# Patient Record
Sex: Female | Born: 1980 | Race: White | Hispanic: No | Marital: Married | Smoking: Former smoker
Health system: Southern US, Community
[De-identification: ages and names within clinical notes are randomized; demographics above are authoritative.]

## PROBLEM LIST (undated history)

## (undated) DIAGNOSIS — R000 Tachycardia, unspecified: Secondary | ICD-10-CM

## (undated) DIAGNOSIS — Z87442 Personal history of urinary calculi: Secondary | ICD-10-CM

## (undated) DIAGNOSIS — I471 Supraventricular tachycardia, unspecified: Secondary | ICD-10-CM

## (undated) DIAGNOSIS — M5412 Radiculopathy, cervical region: Secondary | ICD-10-CM

## (undated) DIAGNOSIS — I7300 Raynaud's syndrome without gangrene: Secondary | ICD-10-CM

## (undated) DIAGNOSIS — I4711 Inappropriate sinus tachycardia, so stated: Secondary | ICD-10-CM

## (undated) DIAGNOSIS — I77810 Thoracic aortic ectasia: Secondary | ICD-10-CM

## (undated) DIAGNOSIS — R112 Nausea with vomiting, unspecified: Secondary | ICD-10-CM

## (undated) DIAGNOSIS — N200 Calculus of kidney: Secondary | ICD-10-CM

## (undated) DIAGNOSIS — F411 Generalized anxiety disorder: Secondary | ICD-10-CM

## (undated) DIAGNOSIS — Q201 Double outlet right ventricle: Secondary | ICD-10-CM

## (undated) DIAGNOSIS — Z860100 Personal history of colon polyps, unspecified: Secondary | ICD-10-CM

## (undated) DIAGNOSIS — Z9889 Other specified postprocedural states: Secondary | ICD-10-CM

## (undated) HISTORY — DX: Thoracic aortic ectasia: I77.810

## (undated) HISTORY — DX: Raynaud's syndrome without gangrene: I73.00

## (undated) HISTORY — DX: Personal history of urinary calculi: Z87.442

## (undated) HISTORY — DX: Generalized anxiety disorder: F41.1

## (undated) HISTORY — DX: Tachycardia, unspecified: R00.0

## (undated) HISTORY — DX: Supraventricular tachycardia, unspecified: I47.10

## (undated) HISTORY — DX: Double outlet right ventricle: Q20.1

## (undated) HISTORY — DX: Personal history of colonic polyps: Z86.010

## (undated) HISTORY — DX: Inappropriate sinus tachycardia, so stated: I47.11

## (undated) HISTORY — DX: Personal history of colon polyps, unspecified: Z86.0100

## (undated) HISTORY — DX: Supraventricular tachycardia: I47.1

## (undated) HISTORY — PX: LEG SURGERY: SHX1003

---
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
## 2004-10-12 ENCOUNTER — Ambulatory Visit: Payer: Self-pay | Admitting: Internal Medicine

## 2004-10-26 ENCOUNTER — Ambulatory Visit: Payer: Self-pay | Admitting: Cardiology

## 2004-11-03 ENCOUNTER — Ambulatory Visit: Payer: Self-pay | Admitting: Cardiology

## 2004-11-22 ENCOUNTER — Ambulatory Visit: Payer: Self-pay | Admitting: Internal Medicine

## 2004-12-30 ENCOUNTER — Ambulatory Visit: Payer: Self-pay | Admitting: Internal Medicine

## 2006-01-26 ENCOUNTER — Ambulatory Visit: Payer: Self-pay | Admitting: Internal Medicine

## 2009-02-13 ENCOUNTER — Telehealth: Payer: Self-pay | Admitting: Internal Medicine

## 2009-02-20 DIAGNOSIS — I471 Supraventricular tachycardia, unspecified: Secondary | ICD-10-CM

## 2009-02-20 DIAGNOSIS — R0602 Shortness of breath: Secondary | ICD-10-CM | POA: Insufficient documentation

## 2009-02-20 DIAGNOSIS — Q203 Discordant ventriculoarterial connection: Secondary | ICD-10-CM

## 2009-02-20 DIAGNOSIS — R209 Unspecified disturbances of skin sensation: Secondary | ICD-10-CM | POA: Insufficient documentation

## 2009-02-20 DIAGNOSIS — F17200 Nicotine dependence, unspecified, uncomplicated: Secondary | ICD-10-CM | POA: Insufficient documentation

## 2009-02-20 DIAGNOSIS — Q201 Double outlet right ventricle: Secondary | ICD-10-CM | POA: Insufficient documentation

## 2009-02-20 DIAGNOSIS — I7300 Raynaud's syndrome without gangrene: Secondary | ICD-10-CM | POA: Insufficient documentation

## 2009-02-20 HISTORY — DX: Supraventricular tachycardia, unspecified: I47.10

## 2009-02-20 HISTORY — DX: Unspecified disturbances of skin sensation: R20.9

## 2009-02-23 ENCOUNTER — Ambulatory Visit: Payer: Self-pay | Admitting: Internal Medicine

## 2009-02-23 DIAGNOSIS — R9431 Abnormal electrocardiogram [ECG] [EKG]: Secondary | ICD-10-CM | POA: Insufficient documentation

## 2009-03-09 ENCOUNTER — Ambulatory Visit: Payer: Self-pay | Admitting: Internal Medicine

## 2009-03-09 ENCOUNTER — Ambulatory Visit (HOSPITAL_COMMUNITY): Admission: RE | Admit: 2009-03-09 | Discharge: 2009-03-09 | Payer: Self-pay | Admitting: Internal Medicine

## 2009-03-09 ENCOUNTER — Ambulatory Visit: Payer: Self-pay

## 2009-03-09 ENCOUNTER — Encounter: Payer: Self-pay | Admitting: Internal Medicine

## 2009-03-11 ENCOUNTER — Telehealth (INDEPENDENT_AMBULATORY_CARE_PROVIDER_SITE_OTHER): Payer: Self-pay | Admitting: *Deleted

## 2009-03-12 ENCOUNTER — Telehealth: Payer: Self-pay | Admitting: Internal Medicine

## 2009-03-27 ENCOUNTER — Encounter: Payer: Self-pay | Admitting: Internal Medicine

## 2009-03-31 ENCOUNTER — Encounter (INDEPENDENT_AMBULATORY_CARE_PROVIDER_SITE_OTHER): Payer: Self-pay | Admitting: *Deleted

## 2009-03-31 ENCOUNTER — Ambulatory Visit: Payer: Self-pay | Admitting: Internal Medicine

## 2009-03-31 DIAGNOSIS — R5383 Other fatigue: Secondary | ICD-10-CM | POA: Insufficient documentation

## 2009-03-31 DIAGNOSIS — R5381 Other malaise: Secondary | ICD-10-CM | POA: Insufficient documentation

## 2009-08-18 ENCOUNTER — Telehealth: Payer: Self-pay | Admitting: Internal Medicine

## 2009-11-03 ENCOUNTER — Encounter (INDEPENDENT_AMBULATORY_CARE_PROVIDER_SITE_OTHER): Payer: Self-pay | Admitting: *Deleted

## 2010-05-13 ENCOUNTER — Encounter (INDEPENDENT_AMBULATORY_CARE_PROVIDER_SITE_OTHER): Payer: Self-pay | Admitting: *Deleted

## 2010-08-10 NOTE — Letter (Signed)
Summary: Appointment - Missed  [facility], Main Office  [address]   [place], [place] [address]   Phone: [phone]  Fax: [phone]     November 03, 2009 MRN: [id number]   [facility] [address] [place], [place]  [address]   Dear Ms. EAST,  Our records indicate you missed your appointment on 10/13/09 with Dr. Graciela Husbands. It is very important that we reach you to reschedule this appointment. We look forward to participating in your health care needs. Please contact us at the number listed above at your earliest convenience to reschedule this appointment.     Sincerely,   Ruel Favors Scheduling Team

## 2010-08-10 NOTE — Letter (Signed)
Summary: Appointment - Missed  [facility], Main Office  [address]   [place], [place] [address]   Phone: [phone]  Fax: [phone]     May 13, 2010 MRN: [id number]   [facility] [address] [place], [place]  [address]   Dear Ms. EAST,  Our records indicate you missed your appointment on 10-13-2009  with Dr.Klein.                                    It is very important that we reach you to reschedule this appointment. We look forward to participating in your health care needs. Please contact us at the number listed above at your earliest convenience to reschedule this appointment.     Sincerely,   Lorne Skeens  [facility] Scheduling Team

## 2010-08-10 NOTE — Progress Notes (Signed)
Summary: **[facility] CB** premeds for dental procedure  Phone Note From Other Clinic   Caller: nurse Junious Dresser Summary of Call: per Junious Dresser Dr Effie Shy oral surgeon. pt needs wisdom teeth extracted does pt need premeds [phone] Initial call taken by: Edman Circle,  August 18, 2009 3:17 PM  Follow-up for Phone Call        Per Dr. Graciela Husbands, pt will need to be premedicated per AHA guidlines. She will need Amoxicillin 2g one hour prior to dental work. I called to advise Junious Dresser and she is out of the office today. They took a message and will have another Nurse call me back today. Duncan Dull, RN, BSN  August 19, 2009 9:03 AM   Additional Follow-up for Phone Call Additional follow up Details #1::        S/W Junious Dresser, she is aware of SBE and she will contact the pt and get the medication for her.  Additional Follow-up by: Duncan Dull, RN, BSN,  August 20, 2009 11:40 AM

## 2010-11-04 ENCOUNTER — Telehealth: Payer: Self-pay | Admitting: Internal Medicine

## 2010-11-04 NOTE — Telephone Encounter (Signed)
Patient is rescheduled for 11/15/10 8:30

## 2010-11-15 ENCOUNTER — Ambulatory Visit (INDEPENDENT_AMBULATORY_CARE_PROVIDER_SITE_OTHER): Payer: PRIVATE HEALTH INSURANCE | Admitting: Internal Medicine

## 2010-11-15 ENCOUNTER — Encounter: Payer: Self-pay | Admitting: Internal Medicine

## 2010-11-15 VITALS — BP 108/76 | HR 84 | Ht 71.0 in | Wt 199.0 lb

## 2010-11-15 DIAGNOSIS — K601 Chronic anal fissure: Secondary | ICD-10-CM

## 2010-11-15 DIAGNOSIS — Z8371 Family history of colonic polyps: Secondary | ICD-10-CM

## 2010-11-15 DIAGNOSIS — K625 Hemorrhage of anus and rectum: Secondary | ICD-10-CM

## 2010-11-15 DIAGNOSIS — K602 Anal fissure, unspecified: Secondary | ICD-10-CM

## 2010-11-15 DIAGNOSIS — R195 Other fecal abnormalities: Secondary | ICD-10-CM

## 2010-11-15 HISTORY — DX: Chronic anal fissure: K60.1

## 2010-11-15 HISTORY — DX: Other fecal abnormalities: R19.5

## 2010-11-15 MED ORDER — AMBULATORY NON FORMULARY MEDICATION: Status: DC

## 2010-11-15 MED ORDER — PEG-KCL-NACL-NASULF-NA ASC-C 100 G PO SOLR: 1.0000 | Freq: Once | ORAL | Status: AC

## 2010-11-15 NOTE — Assessment & Plan Note (Signed)
Could just be IBS. Given the anal fissure, the remaining nodes and family history of autoimmune disorder in her mother with scleroderma I think if her colonoscopy is reasonable and appropriate. Also her father has a history of colon polyps that were diagnosed in his 78s and has an annual colonoscopy suggesting significant family history of colon neoplasia. An aunt had colon cancer as well.

## 2010-11-15 NOTE — Assessment & Plan Note (Addendum)
History and exam are consistent with this today. We'll start with diltiazem gel therapy. Given the loose stools and a family history of colon polyps, a full colonoscopy will be undertaken as well. Risks benefits and indications are explained she understands and agrees to proceed. I do not think prophylactic antibiotics are needed given her prior VSD repair but we'll double check that. (NOT INDICATED PER ASGE) Will ask for records from PCP including hemoglobin.  I explained the nature and causes an anal fissure in the treatment. Given the chronicity, there is a good chance she will need surgical correction.

## 2010-11-15 NOTE — Patient Instructions (Addendum)
Please read the anal fissure handout. Go to [facility] for your diltiazem gel. See you at your colonoscopy next week. Will copy Dr. Fara Chute. Your Colonoscopy is scheduled on 11/22/2010 at 8:30am Your MoviPrep is being sent to [facility] pharmacy today
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
## 2010-11-17 ENCOUNTER — Telehealth: Payer: Self-pay | Admitting: Internal Medicine

## 2010-11-17 MED ORDER — HYDROCODONE-ACETAMINOPHEN 5-500 MG PO TABS
1.0000 | ORAL_TABLET | ORAL | Status: DC | PRN
Start: 2010-11-17 — End: 2010-11-22

## 2010-11-17 MED ORDER — LIDOCAINE HCL 2 % EX GEL: CUTANEOUS | Status: AC

## 2010-11-17 NOTE — Telephone Encounter (Signed)
Lidocaine gel 2% as needed #30 grams or similar amount Hydrocodone 5/500 mg APAP 1 every 4 hours as needed for pain #20 no refills

## 2010-11-17 NOTE — Telephone Encounter (Signed)
Increasing rectal pain.  She is having difficulty sitting.  She did start on the diltiazem gel.  Pain is there all the time much worse with BM.  Can we send her in something for for pain so she is able to work and perform her job and some Lidocaine gel.  Please advise.

## 2010-11-17 NOTE — Telephone Encounter (Signed)
Left message for patient to call back  

## 2010-11-17 NOTE — Telephone Encounter (Signed)
Patient advised of 2 rx sent to the pharmacy.  She is advised not to take hydrocodone and work.  Patient verbalized understanding.

## 2010-11-19 ENCOUNTER — Encounter: Payer: Self-pay | Admitting: Internal Medicine

## 2010-11-22 ENCOUNTER — Ambulatory Visit (AMBULATORY_SURGERY_CENTER): Payer: PRIVATE HEALTH INSURANCE | Admitting: Internal Medicine

## 2010-11-22 ENCOUNTER — Encounter: Payer: Self-pay | Admitting: Internal Medicine

## 2010-11-22 VITALS — BP 111/67 | HR 80 | Temp 97.2°F | Resp 16

## 2010-11-22 DIAGNOSIS — D126 Benign neoplasm of colon, unspecified: Secondary | ICD-10-CM

## 2010-11-22 DIAGNOSIS — R933 Abnormal findings on diagnostic imaging of other parts of digestive tract: Secondary | ICD-10-CM

## 2010-11-22 DIAGNOSIS — K601 Chronic anal fissure: Secondary | ICD-10-CM

## 2010-11-22 DIAGNOSIS — R197 Diarrhea, unspecified: Secondary | ICD-10-CM

## 2010-11-22 DIAGNOSIS — K602 Anal fissure, unspecified: Secondary | ICD-10-CM

## 2010-11-22 HISTORY — PX: COLONOSCOPY W/ BIOPSIES: SHX1374

## 2010-11-22 MED ORDER — SODIUM CHLORIDE 0.9 % IV SOLN: 500.0000 mL | INTRAVENOUS | Status: DC

## 2010-11-22 MED ORDER — HYDROCODONE-ACETAMINOPHEN 5-500 MG PO TABS: 1.0000 | ORAL_TABLET | ORAL | Status: DC | PRN

## 2010-11-22 NOTE — Patient Instructions (Signed)
[employer] reviewed with pt and care partner.  Blue discharge instructions signed by care partner.  Impressions/Findings:  Abnormal mucosa in the distal transverse colon-? 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 Leone Payor refilled hydrocodone. Continue diltiazem.  Polyp handout given.

## 2010-11-22 NOTE — Assessment & Plan Note (Signed)
No evidence of IBD at colonoscopy today

## 2010-11-22 NOTE — Progress Notes (Signed)
Pt. Hard to sedate.  Per Dr. Leone Payor, pt given 25mg  benadryl.  Pt grimacing during procedure.  Resting comfortably once cecum is reached.

## 2010-11-23 ENCOUNTER — Telehealth: Payer: Self-pay

## 2010-11-23 DIAGNOSIS — K602 Anal fissure, unspecified: Secondary | ICD-10-CM

## 2010-11-23 NOTE — Telephone Encounter (Signed)
Patient advised of the appointment date and time

## 2010-11-23 NOTE — Telephone Encounter (Signed)
I have left a message with the patient to please call back to discuss surgical referral scheduled for 12/09/10 arrive at 9:15 for a 9:45 appt with Dr Dwain Sarna.

## 2010-11-23 NOTE — Telephone Encounter (Signed)
Follow up Call- Patient questions:  Do you have a fever, pain , or abdominal swelling? no Pain Score  0 *  Have you tolerated food without any problems? yes  Have you been able to return to your normal activities? yes  Do you have any questions about your discharge instructions: Diet   no Medications  no Follow up visit  no  Do you have questions or concerns about your Care? no  Actions: * If pain score is 4 or above: No action needed, pain <4. "I'm fine.  I slept most of the day" per pt. MAW

## 2010-11-26 NOTE — Assessment & Plan Note (Signed)
Vanessa Thomas                           ELECTROPHYSIOLOGY OFFICE NOTE   Vanessa Thomas, Vanessa Thomas                           MRN:          [id number]  DATE:01/26/2006                            DOB:          01-25-1981    [name] is seen again.  She was seen a year ago for SCT in the context of  right ventricular corrected surgery.  After review with the pediatric EPIs  at __________, their comments were these typically represent typical  tachycardias mechanistically.   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   Her next concern is her shortness of breath which remains significant but  stable.  Review of her echo had demonstrated previously that she had normal  left ventricular function, normal right ventricular function with some right  ventricular dilatation without significant valvular regurgitation.   Her other concern is that her feet have been turning blue.  There is some  tingling on the bottoms.  She has a couple of poorly healing sores on her  feet.  She also has some Raynaud's like symptoms in her hands and has a  family history of scleroderma.   Her medications currently are none.   PHYSICAL EXAMINATION:  VITAL SIGNS:  On examination, her blood pressure is  120/78 and pulse is 85.  LUNGS:  Clear.  HEART:  Heart sounds were regular.  EXTREMITIES:  The extremities had three sores on her right foot.   CARDIOLOGICAL DATA:  The electrocardiogram dated today demonstrated a sinus  rhythm at 85 with interval 0.16/0.14/0.42.  There is right bundle branch  block and right axis deviation.   IMPRESSION:  1.  Corrected double outlet right ventricle.  2.  Supraventricular tachycardia associated with double outlet right      ventricle.  3.  Shortness of breath, question cardiac and  question pulmonary.  4.  Paresthesias and lower extremity symptoms suggestive of Raynaud's.  5.  Ongoing cigarette use.   PLAN:  We have taken a multipronged approach here.  We will plan to:  1.  Undertake CPX testing to look at whether her shortness of breath is      cardiac or pulmonary.  2.  I advised her to take Nicorette gum with a potential use of Chantix      subsequently.  3.  We will review her CPX with Dr. Gala Romney and Tenny Craw to see whether she      would benefit from referral to an adult congenital heart specialist.  4.  Cardiac versus pulmonary rehab based on CPX.   Currently she does not want to pursue SCT ablation and we will keep an eye  on this.  I will plan to see her in six months time.  Vanessa Salvia, MD, [facility]   SCK/MedQ  DD:  01/26/2006  DT:  01/27/2006  Job #:  [id number]   cc:   Doreen Beam

## 2010-11-30 ENCOUNTER — Encounter: Payer: Self-pay | Admitting: Internal Medicine

## 2010-11-30 DIAGNOSIS — Z860100 Personal history of colon polyps, unspecified: Secondary | ICD-10-CM | POA: Insufficient documentation

## 2010-12-03 ENCOUNTER — Other Ambulatory Visit: Payer: Self-pay | Admitting: Internal Medicine

## 2010-12-03 MED ORDER — HYDROCODONE-ACETAMINOPHEN 5-500 MG PO TABS: 1.0000 | ORAL_TABLET | ORAL | Status: AC | PRN

## 2010-12-03 NOTE — Telephone Encounter (Signed)
Patient question if she could have Hydrocodone refilled until here appointments one with the surgeon 5/31. Should this medication be refilled.

## 2010-12-03 NOTE — Telephone Encounter (Signed)
Ok to refill.  I believe I pended the rx but not sure that worked  Use the existing hydrocodone rx and call or fax it in, same sig and dispebnse #30

## 2010-12-03 NOTE — Telephone Encounter (Signed)
Rx for Hydrocodone 5-500 every 4 hours #30 with 0 refills phoned in to [facility], [place], [place] ok'ed per Dr. Leone Payor. Patient informed.

## 2010-12-20 ENCOUNTER — Ambulatory Visit: Payer: Self-pay | Admitting: Internal Medicine

## 2010-12-23 ENCOUNTER — Encounter (INDEPENDENT_AMBULATORY_CARE_PROVIDER_SITE_OTHER): Payer: Self-pay | Admitting: General Surgery

## 2010-12-30 ENCOUNTER — Ambulatory Visit (HOSPITAL_COMMUNITY)
Admission: RE | Admit: 2010-12-30 | Discharge: 2010-12-30 | Disposition: A | Discharge status: Home / Self Care | Payer: PRIVATE HEALTH INSURANCE | Source: Ambulatory Visit | Attending: General Surgery | Admitting: General Surgery

## 2010-12-30 DIAGNOSIS — F17200 Nicotine dependence, unspecified, uncomplicated: Secondary | ICD-10-CM | POA: Insufficient documentation

## 2010-12-30 DIAGNOSIS — K602 Anal fissure, unspecified: Secondary | ICD-10-CM | POA: Insufficient documentation

## 2010-12-30 LAB — HCG, SERUM, QUALITATIVE: Preg, Serum: NEGATIVE

## 2010-12-30 LAB — SURGICAL PCR SCREEN: MRSA, PCR: NEGATIVE

## 2010-12-31 NOTE — Op Note (Signed)
NAME:  Vanessa Thomas, Vanessa Thomas                  ACCOUNT NO.:  [id number]  MEDICAL RECORD NO.:  [id number]  LOCATION:  DAY                          FACILITY:  [facility]  PHYSICIAN:  Juanetta Gosling, MDDATE OF BIRTH:  09/19/1980  DATE OF PROCEDURE:  12/30/2010 DATE OF DISCHARGE:                              OPERATIVE REPORT   PREOPERATIVE DIAGNOSIS:  Chronic anal fissure.  POSTOPERATIVE DIAGNOSIS:  Chronic anal fissure.  PROCEDURES: 1. Exam under anesthesia. 2. Lateral internal sphincterotomy, open. 3. Excision of sentinel pile.  SURGEON:  Juanetta Gosling, MD.  ASSISTANT:  None.  ANESTHESIA:  General.  SPECIMENS:  None.  DRAINS:  None.  COMPLICATIONS:  None.  ESTIMATED BLOOD LOSS:  Minimal.  DISPOSITION:  To recovery room in stable condition.  INDICATIONS:  Vanessa Thomas is a 30 year old female who for almost a year has had rectal pain associated with some bright red blood.  She had been treated symptomatically with no relief of that.  She was seen by Dr. Stan Head, underwent a colonoscopy which was otherwise fairly normal and was placed on diltiazem cream and really had no relief.  I saw her initially on Dec 09, 2010; we discussed a variety of different options including surgery and decided to try to continue to treat her conservatively and I would see her back in 3 weeks.  Since then, she has increased pain.  At that point in time, she did appear to have a posterior anal fissure.  I discussed with her going to the operating room at this point for failure to heal chronic anal fissure in order to get rid of this.  We discussed possible options including a sphincterotomy.  We discussed the risks specifically of temporary and permanent continence associated with this as well as failure of the fissure to heal.  PROCEDURE:  After informed consent, the patient was taken to the operating room.  She had sequential compression devices placed on her lower extremities.  She was  placed under general anesthesia without complications.  She was then prone and appropriately padded.  Her buttocks were taped apart.  Her buttocks were then prepped and draped in standard sterile surgical fashion.  Surgical time-out was then performed.  I inserted the anoscope.  She had the classic sentinel pile, anal papilla and ulcer complex in the posterior midline.  I then was able to identify a very hard, fibrous band of the internal sphincter in the posterior lateral position.  I made a 1-cm incision and I pulled the white fibrous band of the sphincter up and divided this with cautery. This was hemostatic.  I put a small stitch of chromic in this and left this partially open.  I then excised the anal papilla and a portion of the fissure as well.  This was closed with 3-0 chromic suture. Hemostasis was obtained.  I then placed dibucaine as well as some Gelfoam overlying this area.  I infiltrated 20 cc of Exparel in the deep anal space on either side as well.  She tolerated this well, was extubated in the operating room and transferred to recovery room in stable condition.     Juanetta Gosling, MD  MCW/MEDQ  D:  12/30/2010  T:  12/30/2010  Job:  [id number]  cc:   Fara Chute, MD Fax: [phone]  Iva Boop, MD,FACG [facility] [address] [place], [place] [address]  Electronically Signed by Emelia Loron MD on 12/31/2010 03:42:34 PM

## 2011-01-03 ENCOUNTER — Encounter (INDEPENDENT_AMBULATORY_CARE_PROVIDER_SITE_OTHER): Payer: PRIVATE HEALTH INSURANCE | Admitting: General Surgery

## 2011-01-18 ENCOUNTER — Encounter (INDEPENDENT_AMBULATORY_CARE_PROVIDER_SITE_OTHER): Payer: PRIVATE HEALTH INSURANCE | Admitting: General Surgery

## 2012-04-17 DIAGNOSIS — I2510 Atherosclerotic heart disease of native coronary artery without angina pectoris: Secondary | ICD-10-CM

## 2012-07-11 HISTORY — PX: SVT ABLATION: EP1225

## 2012-07-16 DIAGNOSIS — Z8774 Personal history of (corrected) congenital malformations of heart and circulatory system: Secondary | ICD-10-CM

## 2012-07-16 DIAGNOSIS — I4510 Unspecified right bundle-branch block: Secondary | ICD-10-CM | POA: Insufficient documentation

## 2012-07-16 DIAGNOSIS — G4700 Insomnia, unspecified: Secondary | ICD-10-CM | POA: Insufficient documentation

## 2012-07-16 DIAGNOSIS — I77819 Aortic ectasia, unspecified site: Secondary | ICD-10-CM

## 2012-07-16 DIAGNOSIS — F411 Generalized anxiety disorder: Secondary | ICD-10-CM | POA: Insufficient documentation

## 2012-07-16 HISTORY — DX: Insomnia, unspecified: G47.00

## 2012-07-16 HISTORY — DX: Personal history of (corrected) congenital malformations of heart and circulatory system: Z87.74

## 2012-07-16 HISTORY — DX: Aortic ectasia, unspecified site: I77.819

## 2012-07-16 HISTORY — DX: Unspecified right bundle-branch block: I45.10

## 2012-10-04 HISTORY — DX: Rider (driver) (passenger) of other motorcycle injured in unspecified traffic accident, initial encounter: V29.99XA

## 2013-02-18 ENCOUNTER — Encounter (HOSPITAL_COMMUNITY): Payer: Self-pay | Admitting: Emergency Medicine

## 2013-02-18 ENCOUNTER — Emergency Department (HOSPITAL_COMMUNITY): Payer: PRIVATE HEALTH INSURANCE

## 2013-02-18 ENCOUNTER — Emergency Department (HOSPITAL_COMMUNITY)
Admission: EM | Admit: 2013-02-18 | Discharge: 2013-02-18 | Disposition: A | Discharge status: Home / Self Care | Payer: PRIVATE HEALTH INSURANCE | Attending: Emergency Medicine | Admitting: Emergency Medicine

## 2013-02-18 DIAGNOSIS — Z860100 Personal history of colon polyps, unspecified: Secondary | ICD-10-CM | POA: Insufficient documentation

## 2013-02-18 DIAGNOSIS — F3289 Other specified depressive episodes: Secondary | ICD-10-CM | POA: Insufficient documentation

## 2013-02-18 DIAGNOSIS — Z79899 Other long term (current) drug therapy: Secondary | ICD-10-CM | POA: Insufficient documentation

## 2013-02-18 DIAGNOSIS — R109 Unspecified abdominal pain: Secondary | ICD-10-CM

## 2013-02-18 DIAGNOSIS — I471 Supraventricular tachycardia, unspecified: Secondary | ICD-10-CM | POA: Insufficient documentation

## 2013-02-18 DIAGNOSIS — F329 Major depressive disorder, single episode, unspecified: Secondary | ICD-10-CM | POA: Insufficient documentation

## 2013-02-18 DIAGNOSIS — Z3202 Encounter for pregnancy test, result negative: Secondary | ICD-10-CM | POA: Insufficient documentation

## 2013-02-18 DIAGNOSIS — R110 Nausea: Secondary | ICD-10-CM | POA: Insufficient documentation

## 2013-02-18 DIAGNOSIS — Z8679 Personal history of other diseases of the circulatory system: Secondary | ICD-10-CM | POA: Insufficient documentation

## 2013-02-18 DIAGNOSIS — Z87891 Personal history of nicotine dependence: Secondary | ICD-10-CM | POA: Insufficient documentation

## 2013-02-18 DIAGNOSIS — Z9889 Other specified postprocedural states: Secondary | ICD-10-CM | POA: Insufficient documentation

## 2013-02-18 LAB — COMPREHENSIVE METABOLIC PANEL
AST: 12 U/L (ref 0–37)
BUN: 12 mg/dL (ref 6–23)
CO2: 27 mEq/L (ref 19–32)
Calcium: 9.4 mg/dL (ref 8.4–10.5)
Creatinine, Ser: 0.87 mg/dL (ref 0.50–1.10)
GFR calc non Af Amer: 88 mL/min — ABNORMAL LOW (ref >90)

## 2013-02-18 LAB — URINE MICROSCOPIC-ADD ON

## 2013-02-18 LAB — CBC WITH DIFFERENTIAL/PLATELET
Basophils Absolute: 0 10*3/uL (ref 0.0–0.1)
Basophils Relative: 0 % (ref 0–1)
Eosinophils Relative: 1 % (ref 0–5)
HCT: 40.2 % (ref 36.0–46.0)
Lymphocytes Relative: 37 % (ref 12–46)
MCHC: 33.8 g/dL (ref 30.0–36.0)
MCV: 86.8 fL (ref 78.0–100.0)
Monocytes Absolute: 0.5 10*3/uL (ref 0.1–1.0)
Monocytes Relative: 8 % (ref 3–12)
RDW: 12.4 % (ref 11.5–15.5)

## 2013-02-18 LAB — URINALYSIS, ROUTINE W REFLEX MICROSCOPIC
Bilirubin Urine: NEGATIVE
Protein, ur: NEGATIVE mg/dL
Urobilinogen, UA: 0.2 mg/dL (ref 0.0–1.0)

## 2013-02-18 MED ORDER — HYDROMORPHONE HCL PF 1 MG/ML IJ SOLN
1.0000 mg | Freq: Once | INTRAMUSCULAR | Status: AC
  Administered 2013-02-18: 1 mg via INTRAVENOUS
  Filled 2013-02-18: qty 1

## 2013-02-18 MED ORDER — ONDANSETRON HCL 4 MG/2ML IJ SOLN
4.0000 mg | Freq: Once | INTRAMUSCULAR | Status: AC
  Administered 2013-02-18: 4 mg via INTRAVENOUS
  Filled 2013-02-18: qty 2

## 2013-02-18 MED ORDER — OXYCODONE-ACETAMINOPHEN 5-325 MG PO TABS: 1.0000 | ORAL_TABLET | Freq: Four times a day (QID) | ORAL | Status: DC | PRN

## 2013-02-18 MED ORDER — SODIUM CHLORIDE 0.9 % IV SOLN
Freq: Once | INTRAVENOUS | Status: AC
  Administered 2013-02-18: 20 mL/h via INTRAVENOUS

## 2013-02-18 MED ORDER — KETOROLAC TROMETHAMINE 30 MG/ML IJ SOLN
30.0000 mg | Freq: Once | INTRAMUSCULAR | Status: AC
  Administered 2013-02-18: 30 mg via INTRAVENOUS
  Filled 2013-02-18 (×2): qty 1

## 2013-02-18 MED ORDER — PROMETHAZINE HCL 25 MG PO TABS: 25.0000 mg | ORAL_TABLET | Freq: Four times a day (QID) | ORAL | Status: DC | PRN

## 2013-02-18 NOTE — ED Notes (Signed)
Pt c/o rt flank pain with nausea since yesterday. Pt states she has hx of kidney stones.
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
## 2013-02-18 NOTE — ED Notes (Signed)
Nausea decreased - ice chips po given at patient request for something to drink

## 2013-04-10 ENCOUNTER — Emergency Department (HOSPITAL_COMMUNITY): Payer: PRIVATE HEALTH INSURANCE

## 2013-04-10 ENCOUNTER — Emergency Department (HOSPITAL_COMMUNITY)
Admission: EM | Admit: 2013-04-10 | Discharge: 2013-04-10 | Disposition: A | Discharge status: Home / Self Care | Payer: PRIVATE HEALTH INSURANCE | Attending: Emergency Medicine | Admitting: Emergency Medicine

## 2013-04-10 ENCOUNTER — Encounter (HOSPITAL_COMMUNITY): Payer: Self-pay

## 2013-04-10 DIAGNOSIS — Z8774 Personal history of (corrected) congenital malformations of heart and circulatory system: Secondary | ICD-10-CM | POA: Insufficient documentation

## 2013-04-10 DIAGNOSIS — Z860100 Personal history of colon polyps, unspecified: Secondary | ICD-10-CM | POA: Insufficient documentation

## 2013-04-10 DIAGNOSIS — Z8679 Personal history of other diseases of the circulatory system: Secondary | ICD-10-CM | POA: Insufficient documentation

## 2013-04-10 DIAGNOSIS — N390 Urinary tract infection, site not specified: Secondary | ICD-10-CM

## 2013-04-10 DIAGNOSIS — R112 Nausea with vomiting, unspecified: Secondary | ICD-10-CM | POA: Insufficient documentation

## 2013-04-10 DIAGNOSIS — Z87891 Personal history of nicotine dependence: Secondary | ICD-10-CM | POA: Insufficient documentation

## 2013-04-10 DIAGNOSIS — R509 Fever, unspecified: Secondary | ICD-10-CM | POA: Insufficient documentation

## 2013-04-10 DIAGNOSIS — Z79899 Other long term (current) drug therapy: Secondary | ICD-10-CM | POA: Insufficient documentation

## 2013-04-10 DIAGNOSIS — Z3202 Encounter for pregnancy test, result negative: Secondary | ICD-10-CM | POA: Insufficient documentation

## 2013-04-10 LAB — BASIC METABOLIC PANEL
BUN: 9 mg/dL (ref 6–23)
Calcium: 9.4 mg/dL (ref 8.4–10.5)
Chloride: 105 mEq/L (ref 96–112)
Creatinine, Ser: 0.74 mg/dL (ref 0.50–1.10)
GFR calc Af Amer: >90 mL/min (ref >90)

## 2013-04-10 LAB — CBC WITH DIFFERENTIAL/PLATELET
Basophils Absolute: 0 10*3/uL (ref 0.0–0.1)
Basophils Relative: 0 % (ref 0–1)
Eosinophils Absolute: 0 10*3/uL (ref 0.0–0.7)
Eosinophils Relative: 0 % (ref 0–5)
HCT: 40.7 % (ref 36.0–46.0)
Hemoglobin: 13.9 g/dL (ref 12.0–15.0)
MCH: 29.7 pg (ref 26.0–34.0)
MCHC: 34.2 g/dL (ref 30.0–36.0)
MCV: 87 fL (ref 78.0–100.0)
Monocytes Absolute: 0.5 10*3/uL (ref 0.1–1.0)
Monocytes Relative: 7 % (ref 3–12)
RDW: 12.2 % (ref 11.5–15.5)

## 2013-04-10 LAB — URINALYSIS, ROUTINE W REFLEX MICROSCOPIC
Bilirubin Urine: NEGATIVE
Glucose, UA: NEGATIVE mg/dL
Ketones, ur: NEGATIVE mg/dL
Protein, ur: NEGATIVE mg/dL
Urobilinogen, UA: 0.2 mg/dL (ref 0.0–1.0)

## 2013-04-10 LAB — PREGNANCY, URINE: Preg Test, Ur: NEGATIVE

## 2013-04-10 LAB — URINE MICROSCOPIC-ADD ON

## 2013-04-10 MED ORDER — IBUPROFEN 600 MG PO TABS: 600.0000 mg | ORAL_TABLET | Freq: Three times a day (TID) | ORAL | Status: DC | PRN

## 2013-04-10 MED ORDER — MORPHINE SULFATE 4 MG/ML IJ SOLN
6.0000 mg | Freq: Once | INTRAMUSCULAR | Status: AC
  Administered 2013-04-10: 6 mg via INTRAVENOUS
  Filled 2013-04-10: qty 2

## 2013-04-10 MED ORDER — HYDROCODONE-ACETAMINOPHEN 5-325 MG PO TABS: 1.0000 | ORAL_TABLET | ORAL | Status: DC | PRN

## 2013-04-10 MED ORDER — DIPHENHYDRAMINE HCL 50 MG/ML IJ SOLN
INTRAMUSCULAR | Status: AC
  Administered 2013-04-10: 25 mg via INTRAVENOUS
  Filled 2013-04-10: qty 1

## 2013-04-10 MED ORDER — CEPHALEXIN 500 MG PO CAPS: 500.0000 mg | ORAL_CAPSULE | Freq: Four times a day (QID) | ORAL | Status: DC

## 2013-04-10 MED ORDER — ONDANSETRON HCL 4 MG/2ML IJ SOLN
4.0000 mg | Freq: Once | INTRAMUSCULAR | Status: AC
  Administered 2013-04-10: 4 mg via INTRAVENOUS
  Filled 2013-04-10: qty 2

## 2013-04-10 MED ORDER — ONDANSETRON 8 MG PO TBDP: 8.0000 mg | ORAL_TABLET | Freq: Three times a day (TID) | ORAL | Status: DC | PRN

## 2013-04-10 MED ORDER — SODIUM CHLORIDE 0.9 % IV SOLN
1000.0000 mL | Freq: Once | INTRAVENOUS | Status: AC
  Administered 2013-04-10: 1000 mL via INTRAVENOUS

## 2013-04-10 MED ORDER — HYDROMORPHONE HCL PF 1 MG/ML IJ SOLN
1.0000 mg | Freq: Once | INTRAMUSCULAR | Status: AC
  Administered 2013-04-10: 1 mg via INTRAVENOUS
  Filled 2013-04-10: qty 1

## 2013-04-10 MED ORDER — DIPHENHYDRAMINE HCL 50 MG/ML IJ SOLN
25.0000 mg | Freq: Once | INTRAMUSCULAR | Status: AC
  Administered 2013-04-10: 25 mg via INTRAVENOUS

## 2013-04-10 MED ORDER — DEXTROSE 5 % IV SOLN
1.0000 g | Freq: Once | INTRAVENOUS | Status: AC
  Administered 2013-04-10: 1 g via INTRAVENOUS
  Filled 2013-04-10: qty 10

## 2013-04-10 MED ORDER — SODIUM CHLORIDE 0.9 % IV SOLN
1000.0000 mL | INTRAVENOUS | Status: DC
  Administered 2013-04-10: 1000 mL via INTRAVENOUS

## 2013-04-10 NOTE — ED Notes (Signed)
Pt reports nausea, vomiting, and fever since Saturday, saw her pmd on sat. And was started on antibiotics,  +foul odor to urine, and painful urination. Thinks she may have passed a small stone on Monday, cont. To have low back pain.
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
## 2013-04-10 NOTE — ED Notes (Signed)
Patient and family state they do not need anything at this time. 

## 2013-04-10 NOTE — ED Notes (Signed)
Pt's IV site red with rash noted with IV administration of rocephin. Pt states is itching, but denies SOB. EDP notified

## 2013-04-10 NOTE — ED Notes (Signed)
Pt presents with c/o fever, nausea, bilateral flank pain and lower abdominal pain x 3 days.  Pt states has urinary frequency, burning with urination, and hematuria. History of renal stones per pt.

## 2013-04-17 LAB — CULTURE, BLOOD (ROUTINE X 2): Culture: NO GROWTH

## 2014-02-14 ENCOUNTER — Emergency Department (HOSPITAL_COMMUNITY)
Admission: EM | Admit: 2014-02-14 | Discharge: 2014-02-14 | Disposition: A | Discharge status: Home / Self Care | Payer: PRIVATE HEALTH INSURANCE | Attending: Emergency Medicine | Admitting: Emergency Medicine

## 2014-02-14 ENCOUNTER — Encounter (HOSPITAL_COMMUNITY): Payer: Self-pay | Admitting: Emergency Medicine

## 2014-02-14 DIAGNOSIS — Z860100 Personal history of colon polyps, unspecified: Secondary | ICD-10-CM | POA: Insufficient documentation

## 2014-02-14 DIAGNOSIS — Z792 Long term (current) use of antibiotics: Secondary | ICD-10-CM | POA: Diagnosis not present

## 2014-02-14 DIAGNOSIS — Z87891 Personal history of nicotine dependence: Secondary | ICD-10-CM | POA: Insufficient documentation

## 2014-02-14 DIAGNOSIS — R109 Unspecified abdominal pain: Secondary | ICD-10-CM | POA: Diagnosis present

## 2014-02-14 DIAGNOSIS — Z8774 Personal history of (corrected) congenital malformations of heart and circulatory system: Secondary | ICD-10-CM | POA: Insufficient documentation

## 2014-02-14 DIAGNOSIS — Z9889 Other specified postprocedural states: Secondary | ICD-10-CM | POA: Diagnosis not present

## 2014-02-14 DIAGNOSIS — N200 Calculus of kidney: Secondary | ICD-10-CM

## 2014-02-14 DIAGNOSIS — Z79899 Other long term (current) drug therapy: Secondary | ICD-10-CM | POA: Diagnosis not present

## 2014-02-14 DIAGNOSIS — Z3202 Encounter for pregnancy test, result negative: Secondary | ICD-10-CM | POA: Insufficient documentation

## 2014-02-14 DIAGNOSIS — R10A1 Flank pain, right side: Secondary | ICD-10-CM

## 2014-02-14 LAB — URINALYSIS, ROUTINE W REFLEX MICROSCOPIC
BILIRUBIN URINE: NEGATIVE
GLUCOSE, UA: NEGATIVE mg/dL
KETONES UR: NEGATIVE mg/dL
Nitrite: NEGATIVE
PH: 6 (ref 5.0–8.0)
PROTEIN: NEGATIVE mg/dL
Specific Gravity, Urine: 1.01 (ref 1.005–1.030)
Urobilinogen, UA: 0.2 mg/dL (ref 0.0–1.0)

## 2014-02-14 LAB — RAPID URINE DRUG SCREEN, HOSP PERFORMED
Amphetamines: NOT DETECTED
BARBITURATES: NOT DETECTED
BENZODIAZEPINES: NOT DETECTED
Cocaine: NOT DETECTED
Opiates: NOT DETECTED
Tetrahydrocannabinol: NOT DETECTED

## 2014-02-14 LAB — URINE MICROSCOPIC-ADD ON

## 2014-02-14 LAB — POC URINE PREG, ED: Preg Test, Ur: NEGATIVE

## 2014-02-14 MED ORDER — KETOROLAC TROMETHAMINE 60 MG/2ML IM SOLN
60.0000 mg | Freq: Once | INTRAMUSCULAR | Status: AC
  Administered 2014-02-14: 60 mg via INTRAMUSCULAR
  Filled 2014-02-14: qty 2

## 2014-02-14 MED ORDER — OXYCODONE-ACETAMINOPHEN 5-325 MG PO TABS
1.0000 | ORAL_TABLET | Freq: Four times a day (QID) | ORAL | Status: DC | PRN
Start: 2014-02-14 — End: 2014-10-01

## 2014-02-14 MED ORDER — HYDROMORPHONE HCL PF 2 MG/ML IJ SOLN
2.0000 mg | Freq: Once | INTRAMUSCULAR | Status: AC
  Administered 2014-02-14: 2 mg via INTRAMUSCULAR
  Filled 2014-02-14: qty 1
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
## 2014-02-14 NOTE — Discharge Instructions (Signed)
Percocet as prescribed as needed for pain.  Followup with your urologist as scheduled next week.   Kidney Stones Kidney stones (urolithiasis) are deposits that form inside your kidneys. The intense pain is caused by the stone moving through the urinary tract. When the stone moves, the ureter goes into spasm around the stone. The stone is usually passed in the urine.  CAUSES   A disorder that makes certain neck glands produce too much parathyroid hormone (primary hyperparathyroidism).  A buildup of uric acid crystals, similar to gout in your joints.  Narrowing (stricture) of the ureter.  A kidney obstruction present at birth (congenital obstruction).  Previous surgery on the kidney or ureters.  Numerous kidney infections. SYMPTOMS   Feeling sick to your stomach (nauseous).  Throwing up (vomiting).  Blood in the urine (hematuria).  Pain that usually spreads (radiates) to the groin.  Frequency or urgency of urination. DIAGNOSIS   Taking a history and physical exam.  Blood or urine tests.  CT scan.  Occasionally, an examination of the inside of the urinary bladder (cystoscopy) is performed. TREATMENT   Observation.  Increasing your fluid intake.  Extracorporeal shock wave lithotripsy--This is a noninvasive procedure that uses shock waves to break up kidney stones.  Surgery may be needed if you have severe pain or persistent obstruction. There are various surgical procedures. Most of the procedures are performed with the use of small instruments. Only small incisions are needed to accommodate these instruments, so recovery time is minimized. The size, location, and chemical composition are all important variables that will determine the proper choice of action for you. Talk to your health care provider to better understand your situation so that you will minimize the risk of injury to yourself and your kidney.  HOME CARE INSTRUCTIONS   Drink enough water and fluids to  keep your urine clear or pale yellow. This will help you to pass the stone or stone fragments.  Strain all urine through the provided strainer. Keep all particulate matter and stones for your health care provider to see. The stone causing the pain may be as small as a grain of salt. It is very important to use the strainer each and every time you pass your urine. The collection of your stone will allow your health care provider to analyze it and verify that a stone has actually passed. The stone analysis will often identify what you can do to reduce the incidence of recurrences.  Only take over-the-counter or prescription medicines for pain, discomfort, or fever as directed by your health care provider.  Make a follow-up appointment with your health care provider as directed.  Get follow-up X-rays if required. The absence of pain does not always mean that the stone has passed. It may have only stopped moving. If the urine remains completely obstructed, it can cause loss of kidney function or even complete destruction of the kidney. It is your responsibility to make sure X-rays and follow-ups are completed. Ultrasounds of the kidney can show blockages and the status of the kidney. Ultrasounds are not associated with any radiation and can be performed easily in a matter of minutes. SEEK MEDICAL CARE IF:  You experience pain that is progressive and unresponsive to any pain medicine you have been prescribed. SEEK IMMEDIATE MEDICAL CARE IF:   Pain cannot be controlled with the prescribed medicine.  You have a fever or shaking chills.  The severity or intensity of pain increases over 18 hours and is not relieved by  pain medicine.  You develop a new onset of abdominal pain.  You feel faint or pass out.  You are unable to urinate. MAKE SURE YOU:   Understand these instructions.  Will watch your condition.  Will get help right away if you are not doing well or get worse. Document Released:  06/27/2005 Document Revised: 02/27/2013 Document Reviewed: 11/28/2012 [facility] Patient Information 2015 [place], [place]. This information is not intended to replace advice given to you by your health care provider. Make sure you discuss any questions you have with your health care provider.

## 2014-02-14 NOTE — ED Notes (Signed)
Right flank pain.  Had CT scan at [facility] February 04, 2014, says it showed numerous small stone.  Had appointment for urology next week.

## 2014-09-30 ENCOUNTER — Encounter (HOSPITAL_COMMUNITY): Payer: Self-pay

## 2014-09-30 ENCOUNTER — Emergency Department (HOSPITAL_COMMUNITY)
Admission: EM | Admit: 2014-09-30 | Discharge: 2014-10-01 | Disposition: A | Discharge status: Home / Self Care | Payer: PRIVATE HEALTH INSURANCE | Attending: Emergency Medicine | Admitting: Emergency Medicine

## 2014-09-30 DIAGNOSIS — N12 Tubulo-interstitial nephritis, not specified as acute or chronic: Secondary | ICD-10-CM | POA: Diagnosis not present

## 2014-09-30 DIAGNOSIS — Z8679 Personal history of other diseases of the circulatory system: Secondary | ICD-10-CM | POA: Insufficient documentation

## 2014-09-30 DIAGNOSIS — Q201 Double outlet right ventricle: Secondary | ICD-10-CM | POA: Insufficient documentation

## 2014-09-30 DIAGNOSIS — Z3202 Encounter for pregnancy test, result negative: Secondary | ICD-10-CM | POA: Insufficient documentation

## 2014-09-30 DIAGNOSIS — R109 Unspecified abdominal pain: Secondary | ICD-10-CM

## 2014-09-30 DIAGNOSIS — Z87891 Personal history of nicotine dependence: Secondary | ICD-10-CM | POA: Insufficient documentation

## 2014-09-30 DIAGNOSIS — Z86010 Personal history of colonic polyps: Secondary | ICD-10-CM | POA: Insufficient documentation

## 2014-09-30 DIAGNOSIS — R1030 Lower abdominal pain, unspecified: Secondary | ICD-10-CM | POA: Diagnosis present

## 2014-09-30 DIAGNOSIS — Z9889 Other specified postprocedural states: Secondary | ICD-10-CM | POA: Diagnosis not present

## 2014-09-30 NOTE — ED Notes (Signed)
Patient states lower back pain, more on the right. Patient states she is having difficulty urinating.

## 2014-10-01 ENCOUNTER — Emergency Department (HOSPITAL_COMMUNITY): Payer: PRIVATE HEALTH INSURANCE

## 2014-10-01 LAB — BASIC METABOLIC PANEL
Anion gap: 7 (ref 5–15)
BUN: 11 mg/dL (ref 6–23)
CO2: 25 mmol/L (ref 19–32)
CREATININE: 0.86 mg/dL (ref 0.50–1.10)
Calcium: 9.5 mg/dL (ref 8.4–10.5)
Chloride: 109 mmol/L (ref 96–112)
GFR calc non Af Amer: 88 mL/min — ABNORMAL LOW (ref >90)
Glucose, Bld: 106 mg/dL — ABNORMAL HIGH (ref 70–99)
Potassium: 3.5 mmol/L (ref 3.5–5.1)
Sodium: 141 mmol/L (ref 135–145)

## 2014-10-01 LAB — URINE MICROSCOPIC-ADD ON

## 2014-10-01 LAB — URINALYSIS, ROUTINE W REFLEX MICROSCOPIC
Bilirubin Urine: NEGATIVE
Glucose, UA: NEGATIVE mg/dL
Ketones, ur: NEGATIVE mg/dL
Nitrite: NEGATIVE
PH: 7.5 (ref 5.0–8.0)
Protein, ur: 30 mg/dL — AB
SPECIFIC GRAVITY, URINE: 1.02 (ref 1.005–1.030)
UROBILINOGEN UA: 0.2 mg/dL (ref 0.0–1.0)

## 2014-10-01 LAB — CBC WITH DIFFERENTIAL/PLATELET
Basophils Absolute: 0 10*3/uL (ref 0.0–0.1)
Basophils Relative: 0 % (ref 0–1)
Eosinophils Absolute: 0 10*3/uL (ref 0.0–0.7)
Eosinophils Relative: 0 % (ref 0–5)
HCT: 42 % (ref 36.0–46.0)
HEMOGLOBIN: 14.2 g/dL (ref 12.0–15.0)
LYMPHS ABS: 1.1 10*3/uL (ref 0.7–4.0)
Lymphocytes Relative: 8 % — ABNORMAL LOW (ref 12–46)
MCH: 30 pg (ref 26.0–34.0)
MCHC: 33.8 g/dL (ref 30.0–36.0)
MCV: 88.6 fL (ref 78.0–100.0)
MONOS PCT: 7 % (ref 3–12)
Monocytes Absolute: 1 10*3/uL (ref 0.1–1.0)
NEUTROS PCT: 85 % — AB (ref 43–77)
Neutro Abs: 11.7 10*3/uL — ABNORMAL HIGH (ref 1.7–7.7)
Platelets: 229 10*3/uL (ref 150–400)
RBC: 4.74 MIL/uL (ref 3.87–5.11)
RDW: 12.1 % (ref 11.5–15.5)
WBC: 13.8 10*3/uL — ABNORMAL HIGH (ref 4.0–10.5)

## 2014-10-01 LAB — PREGNANCY, URINE: PREG TEST UR: NEGATIVE

## 2014-10-01 MED ORDER — ONDANSETRON HCL 4 MG/2ML IJ SOLN
INTRAMUSCULAR | Status: AC
  Filled 2014-10-01: qty 2

## 2014-10-01 MED ORDER — CIPROFLOXACIN HCL 500 MG PO TABS: 500.0000 mg | ORAL_TABLET | Freq: Two times a day (BID) | ORAL | Status: DC

## 2014-10-01 MED ORDER — OXYCODONE-ACETAMINOPHEN 5-325 MG PO TABS: 1.0000 | ORAL_TABLET | Freq: Four times a day (QID) | ORAL | Status: DC | PRN

## 2014-10-01 MED ORDER — SODIUM CHLORIDE 0.9 % IV BOLUS (SEPSIS)
1000.0000 mL | Freq: Once | INTRAVENOUS | Status: AC
  Administered 2014-10-01: 1000 mL via INTRAVENOUS

## 2014-10-01 MED ORDER — HYDROMORPHONE HCL 1 MG/ML IJ SOLN
INTRAMUSCULAR | Status: AC
  Filled 2014-10-01: qty 1

## 2014-10-01 MED ORDER — ONDANSETRON 4 MG PO TBDP: 4.0000 mg | ORAL_TABLET | Freq: Three times a day (TID) | ORAL | Status: DC | PRN

## 2014-10-01 MED ORDER — ONDANSETRON HCL 4 MG/2ML IJ SOLN
4.0000 mg | Freq: Once | INTRAMUSCULAR | Status: AC
  Administered 2014-10-01: 4 mg via INTRAVENOUS

## 2014-10-01 MED ORDER — HYDROMORPHONE HCL 1 MG/ML IJ SOLN
1.0000 mg | Freq: Once | INTRAMUSCULAR | Status: AC
  Administered 2014-10-01: 1 mg via INTRAVENOUS
  Filled 2014-10-01: qty 1

## 2014-10-01 MED ORDER — DEXTROSE 5 % IV SOLN
1.0000 g | Freq: Once | INTRAVENOUS | Status: AC
  Administered 2014-10-01: 1 g via INTRAVENOUS
  Filled 2014-10-01: qty 10

## 2014-10-01 MED ORDER — HYDROMORPHONE HCL 1 MG/ML IJ SOLN
1.0000 mg | Freq: Once | INTRAMUSCULAR | Status: AC
Start: 2014-10-01 — End: 2014-10-01
  Administered 2014-10-01: 1 mg via INTRAVENOUS
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
## 2014-10-01 NOTE — Discharge Instructions (Signed)
Pyelonephritis, Adult °Pyelonephritis is a kidney infection. In general, there are 2 main types of pyelonephritis: °· Infections that come on quickly without any warning (acute pyelonephritis). °· Infections that persist for a long period of time (chronic pyelonephritis). °CAUSES  °Two main causes of pyelonephritis are: °· Bacteria traveling from the bladder to the kidney. This is a problem especially in pregnant women. The urine in the bladder can become filled with bacteria from multiple causes, including: °¨ Inflammation of the prostate gland (prostatitis). °¨ Sexual intercourse in females. °¨ Bladder infection (cystitis). °· Bacteria traveling from the bloodstream to the tissue part of the kidney. °Problems that may increase your risk of getting a kidney infection include: °· Diabetes. °· Kidney stones or bladder stones. °· Cancer. °· Catheters placed in the bladder. °· Other abnormalities of the kidney or ureter. °SYMPTOMS  °· Abdominal pain. °· Pain in the side or flank area. °· Fever. °· Chills. °· Upset stomach. °· Blood in the urine (dark urine). °· Frequent urination. °· Strong or persistent urge to urinate. °· Burning or stinging when urinating. °DIAGNOSIS  °Your caregiver may diagnose your kidney infection based on your symptoms. A urine sample may also be taken. °TREATMENT  °In general, treatment depends on how severe the infection is.  °· If the infection is mild and caught early, your caregiver may treat you with oral antibiotics and send you home. °· If the infection is more severe, the bacteria may have gotten into the bloodstream. This will require intravenous (IV) antibiotics and a hospital stay. Symptoms may include: °¨ High fever. °¨ Severe flank pain. °¨ Shaking chills. °· Even after a hospital stay, your caregiver may require you to be on oral antibiotics for a period of time. °· Other treatments may be required depending upon the cause of the infection. °HOME CARE INSTRUCTIONS  °· Take your  antibiotics as directed. Finish them even if you start to feel better. °· Make an appointment to have your urine checked to make sure the infection is gone. °· Drink enough fluids to keep your urine clear or pale yellow. °· Take medicines for the bladder if you have urgency and frequency of urination as directed by your caregiver. °SEEK IMMEDIATE MEDICAL CARE IF:  °· You have a fever or persistent symptoms for more than 2-3 days. °· You have a fever and your symptoms suddenly get worse. °· You are unable to take your antibiotics or fluids. °· You develop shaking chills. °· You experience extreme weakness or fainting. °· There is no improvement after 2 days of treatment. °MAKE SURE YOU: °· Understand these instructions. °· Will watch your condition. °· Will get help right away if you are not doing well or get worse. °Document Released: 06/27/2005 Document Revised: 12/27/2011 Document Reviewed: 12/01/2010 °[employer]® Patient Information ©2015 [employer]. This information is not intended to replace advice given to you by your health care provider. Make sure you discuss any questions you have with your health care provider. ° °

## 2014-10-03 LAB — URINE CULTURE: Colony Count: >=100000

## 2014-10-04 ENCOUNTER — Telehealth: Payer: Self-pay | Admitting: Emergency Medicine

## 2014-10-04 NOTE — Telephone Encounter (Signed)
Post ED Visit - Positive Culture Follow-up  Culture report reviewed by antimicrobial stewardship pharmacist: []  Wes [place], Pharm.D., BCPS []  Heide Guile, Pharm.D., BCPS []  Alycia Rossetti, Pharm.D., BCPS []  Villa Calma, Pharm.D., BCPS, AAHIVP [x]  Legrand Como, Pharm.D., BCPS, AAHIVP []  Isac Sarna, Pharm.D., BCPS  Positive urine culture Treated with Cipro, organism sensitive to the same and no further patient follow-up is required at this time.  Myrna Blazer 10/04/2014, 9:46 AM

## 2015-02-05 ENCOUNTER — Emergency Department: Payer: PRIVATE HEALTH INSURANCE

## 2015-02-05 ENCOUNTER — Emergency Department
Admission: EM | Admit: 2015-02-05 | Discharge: 2015-02-05 | Disposition: A | Discharge status: Home / Self Care | Payer: PRIVATE HEALTH INSURANCE | Attending: Student | Admitting: Student

## 2015-02-05 ENCOUNTER — Encounter: Payer: Self-pay | Admitting: Emergency Medicine

## 2015-02-05 DIAGNOSIS — Z3202 Encounter for pregnancy test, result negative: Secondary | ICD-10-CM | POA: Insufficient documentation

## 2015-02-05 DIAGNOSIS — N200 Calculus of kidney: Secondary | ICD-10-CM | POA: Diagnosis not present

## 2015-02-05 DIAGNOSIS — N390 Urinary tract infection, site not specified: Secondary | ICD-10-CM | POA: Diagnosis not present

## 2015-02-05 DIAGNOSIS — R109 Unspecified abdominal pain: Secondary | ICD-10-CM | POA: Diagnosis present

## 2015-02-05 DIAGNOSIS — Z720 Tobacco use: Secondary | ICD-10-CM | POA: Insufficient documentation

## 2015-02-05 DIAGNOSIS — Z79899 Other long term (current) drug therapy: Secondary | ICD-10-CM | POA: Insufficient documentation

## 2015-02-05 HISTORY — DX: Calculus of kidney: N20.0

## 2015-02-05 LAB — CBC
HEMATOCRIT: 46.3 % (ref 35.0–47.0)
HEMOGLOBIN: 15.5 g/dL (ref 12.0–16.0)
MCH: 28.9 pg (ref 26.0–34.0)
MCHC: 33.4 g/dL (ref 32.0–36.0)
MCV: 86.5 fL (ref 80.0–100.0)
Platelets: 219 10*3/uL (ref 150–440)
RBC: 5.35 MIL/uL — ABNORMAL HIGH (ref 3.80–5.20)
RDW: 14.3 % (ref 11.5–14.5)
WBC: 12 10*3/uL — ABNORMAL HIGH (ref 3.6–11.0)

## 2015-02-05 LAB — URINALYSIS COMPLETE WITH MICROSCOPIC (ARMC ONLY)
Bilirubin Urine: NEGATIVE
Glucose, UA: NEGATIVE mg/dL
Ketones, ur: NEGATIVE mg/dL
Leukocytes, UA: NEGATIVE
NITRITE: NEGATIVE
PH: 6 (ref 5.0–8.0)
Protein, ur: 30 mg/dL — AB
Specific Gravity, Urine: 1.015 (ref 1.005–1.030)

## 2015-02-05 LAB — COMPREHENSIVE METABOLIC PANEL
ALK PHOS: 73 U/L (ref 38–126)
ALT: 11 U/L — ABNORMAL LOW (ref 14–54)
AST: 18 U/L (ref 15–41)
Albumin: 4 g/dL (ref 3.5–5.0)
Anion gap: 8 (ref 5–15)
BUN: 10 mg/dL (ref 6–20)
CHLORIDE: 104 mmol/L (ref 101–111)
CO2: 26 mmol/L (ref 22–32)
CREATININE: 0.91 mg/dL (ref 0.44–1.00)
Calcium: 9.1 mg/dL (ref 8.9–10.3)
GFR calc non Af Amer: >60 mL/min (ref >60)
GLUCOSE: 83 mg/dL (ref 65–99)
Potassium: 3.2 mmol/L — ABNORMAL LOW (ref 3.5–5.1)
Sodium: 138 mmol/L (ref 135–145)
Total Bilirubin: 0.5 mg/dL (ref 0.3–1.2)
Total Protein: 7.9 g/dL (ref 6.5–8.1)

## 2015-02-05 LAB — PREGNANCY, URINE: PREG TEST UR: NEGATIVE

## 2015-02-05 MED ORDER — OXYCODONE-ACETAMINOPHEN 5-325 MG PO TABS
1.0000 | ORAL_TABLET | Freq: Once | ORAL | Status: AC
  Administered 2015-02-05: 1 via ORAL

## 2015-02-05 MED ORDER — OXYCODONE-ACETAMINOPHEN 5-325 MG PO TABS
ORAL_TABLET | ORAL | Status: AC
  Filled 2015-02-05: qty 1

## 2015-02-05 MED ORDER — OXYCODONE-ACETAMINOPHEN 5-325 MG PO TABS: 1.0000 | ORAL_TABLET | Freq: Three times a day (TID) | ORAL | Status: DC | PRN

## 2015-02-05 MED ORDER — KETOROLAC TROMETHAMINE 30 MG/ML IJ SOLN
30.0000 mg | Freq: Once | INTRAMUSCULAR | Status: AC
Start: 2015-02-05 — End: 2015-02-05
  Administered 2015-02-05: 30 mg via INTRAVENOUS

## 2015-02-05 MED ORDER — HYDROMORPHONE HCL 1 MG/ML IJ SOLN
INTRAMUSCULAR | Status: AC
  Administered 2015-02-05: 1 mg via INTRAVENOUS
  Filled 2015-02-05: qty 1

## 2015-02-05 MED ORDER — SODIUM CHLORIDE 0.9 % IV SOLN
Freq: Once | INTRAVENOUS | Status: AC
  Administered 2015-02-05: 21:00:00 via INTRAVENOUS

## 2015-02-05 MED ORDER — HYDROMORPHONE HCL 1 MG/ML PO LIQD: 1.0000 mg | ORAL | Status: DC

## 2015-02-05 MED ORDER — HYDROMORPHONE HCL 1 MG/ML IJ SOLN
1.0000 mg | INTRAMUSCULAR | Status: AC
  Administered 2015-02-05: 1 mg via INTRAVENOUS

## 2015-02-05 MED ORDER — ONDANSETRON HCL 4 MG/2ML IJ SOLN
4.0000 mg | Freq: Once | INTRAMUSCULAR | Status: AC
  Administered 2015-02-05: 4 mg via INTRAVENOUS
  Filled 2015-02-05: qty 2

## 2015-02-05 MED ORDER — CIPROFLOXACIN HCL 500 MG PO TABS: 500.0000 mg | ORAL_TABLET | Freq: Two times a day (BID) | ORAL | Status: AC

## 2015-02-05 MED ORDER — TAMSULOSIN HCL 0.4 MG PO CAPS: 0.4000 mg | ORAL_CAPSULE | Freq: Every day | ORAL | Status: DC

## 2015-02-05 MED ORDER — KETOROLAC TROMETHAMINE 30 MG/ML IJ SOLN
INTRAMUSCULAR | Status: AC
  Filled 2015-02-05: qty 1

## 2015-02-05 MED ORDER — ONDANSETRON 4 MG PO TBDP: 4.0000 mg | ORAL_TABLET | Freq: Three times a day (TID) | ORAL | Status: DC | PRN

## 2015-02-05 MED ORDER — ONDANSETRON HCL 4 MG/2ML IJ SOLN
4.0000 mg | Freq: Once | INTRAMUSCULAR | Status: AC | PRN
  Administered 2015-02-05: 4 mg via INTRAVENOUS

## 2015-02-05 MED ORDER — HYDROMORPHONE HCL 1 MG/ML IJ SOLN
1.0000 mg | Freq: Once | INTRAMUSCULAR | Status: AC
  Administered 2015-02-05: 1 mg via INTRAVENOUS
  Filled 2015-02-05: qty 1

## 2015-02-05 MED ORDER — SODIUM CHLORIDE 0.9 % IV SOLN
Freq: Once | INTRAVENOUS | Status: AC
  Administered 2015-02-05: 22:00:00 via INTRAVENOUS

## 2015-02-05 MED ORDER — ONDANSETRON HCL 4 MG/2ML IJ SOLN
INTRAMUSCULAR | Status: AC
  Filled 2015-02-05: qty 2
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
## 2015-02-05 NOTE — Discharge Instructions (Signed)
Take medication as prescribed. Drink plenty of fluids. Rest. Follow-up with her primary care physician or urology tomorrow.   Return to the ER for increased pain, fever, inability to tolerate food or fluids, new or worsening concerns.  Kidney Stones Kidney stones (urolithiasis) are solid masses that form inside your kidneys. The intense pain is caused by the stone moving through the kidney, ureter, bladder, and urethra (urinary tract). When the stone moves, the ureter starts to spasm around the stone. The stone is usually passed in your pee (urine).  HOME CARE  Drink enough fluids to keep your pee clear or pale yellow. This helps to get the stone out.  Strain all pee through the provided strainer. Do not pee without peeing through the strainer, not even once. If you pee the stone out, catch it in the strainer. The stone may be as small as a grain of salt. Take this to your doctor. This will help your doctor figure out what you can do to try to prevent more kidney stones.  Only take medicine as told by your doctor.  Follow up with your doctor as told.  Get follow-up X-rays as told by your doctor. GET HELP IF: You have pain that gets worse even if you have been taking pain medicine. GET HELP RIGHT AWAY IF:   Your pain does not get better with medicine.  You have a fever or shaking chills.  Your pain increases and gets worse over 18 hours.  You have new belly (abdominal) pain.  You feel faint or pass out.  You are unable to pee. MAKE SURE YOU:   Understand these instructions.  Will watch your condition.  Will get help right away if you are not doing well or get worse. Document Released: 12/14/2007 Document Revised: 02/27/2013 Document Reviewed: 11/28/2012 [facility] Patient Information 2015 [place], [place]. This information is not intended to replace advice given to you by your health care provider. Make sure you discuss any questions you have with your health care  provider.  Urinary Tract Infection Urinary tract infections (UTIs) can develop anywhere along your urinary tract. Your urinary tract is your body's drainage system for removing wastes and extra water. Your urinary tract includes two kidneys, two ureters, a bladder, and a urethra. Your kidneys are a pair of bean-shaped organs. Each kidney is about the size of your fist. They are located below your ribs, one on each side of your spine. CAUSES Infections are caused by microbes, which are microscopic organisms, including fungi, viruses, and bacteria. These organisms are so small that they can only be seen through a microscope. Bacteria are the microbes that most commonly cause UTIs. SYMPTOMS  Symptoms of UTIs may vary by age and gender of the patient and by the location of the infection. Symptoms in young women typically include a frequent and intense urge to urinate and a painful, burning feeling in the bladder or urethra during urination. Older women and men are more likely to be tired, shaky, and weak and have muscle aches and abdominal pain. A fever may mean the infection is in your kidneys. Other symptoms of a kidney infection include pain in your back or sides below the ribs, nausea, and vomiting. DIAGNOSIS To diagnose a UTI, your caregiver will ask you about your symptoms. Your caregiver also will ask to provide a urine sample. The urine sample will be tested for bacteria and white blood cells. White blood cells are made by your body to help fight infection. TREATMENT  Typically, UTIs can be treated with medication. Because most UTIs are caused by a bacterial infection, they usually can be treated with the use of antibiotics. The choice of antibiotic and length of treatment depend on your symptoms and the type of bacteria causing your infection. HOME CARE INSTRUCTIONS  If you were prescribed antibiotics, take them exactly as your caregiver instructs you. Finish the medication even if you feel better  after you have only taken some of the medication.  Drink enough water and fluids to keep your urine clear or pale yellow.  Avoid caffeine, tea, and carbonated beverages. They tend to irritate your bladder.  Empty your bladder often. Avoid holding urine for long periods of time.  Empty your bladder before and after sexual intercourse.  After a bowel movement, women should cleanse from front to back. Use each tissue only once. SEEK MEDICAL CARE IF:   You have back pain.  You develop a fever.  Your symptoms do not begin to resolve within 3 days. SEEK IMMEDIATE MEDICAL CARE IF:   You have severe back pain or lower abdominal pain.  You develop chills.  You have nausea or vomiting.  You have continued burning or discomfort with urination. MAKE SURE YOU:   Understand these instructions.  Will watch your condition.  Will get help right away if you are not doing well or get worse. Document Released: 04/06/2005 Document Revised: 12/27/2011 Document Reviewed: 08/05/2011 [facility] Patient Information 2015 [place], [place]. This information is not intended to replace advice given to you by your health care provider. Make sure you discuss any questions you have with your health care provider.

## 2015-02-05 NOTE — ED Notes (Signed)
Pt presents to ER alert and in NAD. Pt reports hx of kidney stones and states this feels the same. Pt reports right flank pain and dysuria.

## 2015-02-05 NOTE — ED Notes (Signed)
Patient transported to CT 

## 2015-04-20 ENCOUNTER — Other Ambulatory Visit (HOSPITAL_COMMUNITY): Payer: Self-pay | Admitting: Internal Medicine

## 2015-04-20 DIAGNOSIS — N201 Calculus of ureter: Secondary | ICD-10-CM

## 2015-04-21 ENCOUNTER — Ambulatory Visit (HOSPITAL_COMMUNITY)
Admission: RE | Admit: 2015-04-21 | Discharge: 2015-04-21 | Disposition: A | Discharge status: Home / Self Care | Payer: PRIVATE HEALTH INSURANCE | Source: Ambulatory Visit | Attending: Internal Medicine | Admitting: Internal Medicine

## 2015-04-21 DIAGNOSIS — N200 Calculus of kidney: Secondary | ICD-10-CM | POA: Insufficient documentation

## 2015-04-21 DIAGNOSIS — N201 Calculus of ureter: Secondary | ICD-10-CM | POA: Diagnosis present

## 2015-04-21 DIAGNOSIS — K5730 Diverticulosis of large intestine without perforation or abscess without bleeding: Secondary | ICD-10-CM | POA: Diagnosis not present

## 2015-06-14 ENCOUNTER — Emergency Department
Admission: EM | Admit: 2015-06-14 | Discharge: 2015-06-15 | Disposition: A | Discharge status: Home / Self Care | Payer: PRIVATE HEALTH INSURANCE | Attending: Emergency Medicine | Admitting: Emergency Medicine

## 2015-06-14 ENCOUNTER — Emergency Department: Payer: PRIVATE HEALTH INSURANCE

## 2015-06-14 ENCOUNTER — Encounter: Payer: Self-pay | Admitting: Emergency Medicine

## 2015-06-14 DIAGNOSIS — R079 Chest pain, unspecified: Secondary | ICD-10-CM | POA: Diagnosis not present

## 2015-06-14 DIAGNOSIS — H538 Other visual disturbances: Secondary | ICD-10-CM | POA: Insufficient documentation

## 2015-06-14 DIAGNOSIS — Z79899 Other long term (current) drug therapy: Secondary | ICD-10-CM | POA: Diagnosis not present

## 2015-06-14 DIAGNOSIS — F17210 Nicotine dependence, cigarettes, uncomplicated: Secondary | ICD-10-CM | POA: Insufficient documentation

## 2015-06-14 DIAGNOSIS — R519 Headache, unspecified: Secondary | ICD-10-CM

## 2015-06-14 DIAGNOSIS — R51 Headache: Secondary | ICD-10-CM | POA: Insufficient documentation

## 2015-06-14 LAB — URINALYSIS COMPLETE WITH MICROSCOPIC (ARMC ONLY)
BACTERIA UA: NONE SEEN
BILIRUBIN URINE: NEGATIVE
GLUCOSE, UA: NEGATIVE mg/dL
Ketones, ur: NEGATIVE mg/dL
LEUKOCYTES UA: NEGATIVE
Nitrite: NEGATIVE
PH: 6 (ref 5.0–8.0)
Protein, ur: NEGATIVE mg/dL
Specific Gravity, Urine: 1.013 (ref 1.005–1.030)

## 2015-06-14 LAB — BASIC METABOLIC PANEL
Anion gap: 6 (ref 5–15)
BUN: 11 mg/dL (ref 6–20)
CHLORIDE: 106 mmol/L (ref 101–111)
CO2: 29 mmol/L (ref 22–32)
Calcium: 10 mg/dL (ref 8.9–10.3)
Creatinine, Ser: 0.82 mg/dL (ref 0.44–1.00)
GFR calc Af Amer: >60 mL/min (ref >60)
GFR calc non Af Amer: >60 mL/min (ref >60)
Glucose, Bld: 89 mg/dL (ref 65–99)
Potassium: 3.8 mmol/L (ref 3.5–5.1)
Sodium: 141 mmol/L (ref 135–145)

## 2015-06-14 LAB — CBC
HCT: 46.5 % (ref 35.0–47.0)
Hemoglobin: 15.5 g/dL (ref 12.0–16.0)
MCH: 29.1 pg (ref 26.0–34.0)
MCHC: 33.4 g/dL (ref 32.0–36.0)
MCV: 87.1 fL (ref 80.0–100.0)
PLATELETS: 253 10*3/uL (ref 150–440)
RBC: 5.33 MIL/uL — ABNORMAL HIGH (ref 3.80–5.20)
RDW: 12.8 % (ref 11.5–14.5)
WBC: 11.9 10*3/uL — ABNORMAL HIGH (ref 3.6–11.0)

## 2015-06-14 LAB — TROPONIN I: Troponin I: <0.03 ng/mL (ref <0.031)

## 2015-06-14 MED ORDER — BUTALBITAL-APAP-CAFFEINE 50-325-40 MG PO TABS
2.0000 | ORAL_TABLET | ORAL | Status: AC
  Administered 2015-06-14: 2 via ORAL
  Filled 2015-06-14: qty 2

## 2015-06-14 MED ORDER — DIPHENHYDRAMINE HCL 50 MG/ML IJ SOLN
25.0000 mg | Freq: Once | INTRAMUSCULAR | Status: AC
  Administered 2015-06-14: 25 mg via INTRAVENOUS
  Filled 2015-06-14: qty 1

## 2015-06-14 MED ORDER — ONDANSETRON HCL 4 MG/2ML IJ SOLN
4.0000 mg | Freq: Once | INTRAMUSCULAR | Status: AC
  Administered 2015-06-14: 4 mg via INTRAVENOUS
  Filled 2015-06-14: qty 2

## 2015-06-14 MED ORDER — MORPHINE SULFATE (PF) 4 MG/ML IV SOLN
4.0000 mg | Freq: Once | INTRAVENOUS | Status: AC
  Administered 2015-06-14: 4 mg via INTRAVENOUS
  Filled 2015-06-14: qty 1

## 2015-06-14 MED ORDER — PROMETHAZINE HCL 12.5 MG PO TABS: 25.0000 mg | ORAL_TABLET | Freq: Four times a day (QID) | ORAL | Status: DC | PRN

## 2015-06-14 MED ORDER — KETOROLAC TROMETHAMINE 30 MG/ML IJ SOLN
30.0000 mg | Freq: Once | INTRAMUSCULAR | Status: AC
  Administered 2015-06-14: 30 mg via INTRAVENOUS
  Filled 2015-06-14: qty 1

## 2015-06-14 MED ORDER — METOCLOPRAMIDE HCL 10 MG PO TABS
10.0000 mg | ORAL_TABLET | Freq: Once | ORAL | Status: AC
  Administered 2015-06-14: 10 mg via ORAL
  Filled 2015-06-14: qty 1

## 2015-06-14 NOTE — Discharge Instructions (Signed)
You evaluated for headache and chest discomfort, and although no certain cause was found, your exam and evaluation in the emergency department are reassuring.  We discussed although no certain cause was found for your headache, I am most suspicious that this is migraine headache. You may try taking over-the-counter ibuprofen 600 mg plus over-the-counter Benadryl 25 mg plus Phenergan tablet at onset of headache.  However given the new onset headaches, I do think you should follow-up with a neurologist. Call to make an appointment.  In terms of the chest discomfort, your exam is reassuring. Follow up with your cardiologist.  Return to the emergency department for any worsening condition including new or worsening chest pain, nausea, sweating, trouble breathing, fever, or any altered mental status, confusion, weakness, numbness, seizure, or any other symptoms concerning to you.   General Headache Without Cause A headache is pain or discomfort felt around the head or neck area. The specific cause of a headache may not be found. There are many causes and types of headaches. A few common ones are:  Tension headaches.  Migraine headaches.  Cluster headaches.  Chronic daily headaches. HOME CARE INSTRUCTIONS  Watch your condition for any changes. Take these steps to help with your condition: Managing Pain  Take over-the-counter and prescription medicines only as told by your health care provider.  Lie down in a dark, quiet room when you have a headache.  If directed, apply ice to the head and neck area:  Put ice in a plastic bag.  Place a towel between your skin and the bag.  Leave the ice on for 20 minutes, 2-3 times per day.  Use a heating pad or hot shower to apply heat to the head and neck area as told by your health care provider.  Keep lights dim if bright lights bother you or make your headaches worse. Eating and Drinking  Eat meals on a regular schedule.  Limit alcohol  use.  Decrease the amount of caffeine you drink, or stop drinking caffeine. General Instructions  Keep all follow-up visits as told by your health care provider. This is important.  Keep a headache journal to help find out what may trigger your headaches. For example, write down:  What you eat and drink.  How much sleep you get.  Any change to your diet or medicines.  Try massage or other relaxation techniques.  Limit stress.  Sit up straight, and do not tense your muscles.  Do not use tobacco products, including cigarettes, chewing tobacco, or e-cigarettes. If you need help quitting, ask your health care provider.  Exercise regularly as told by your health care provider.  Sleep on a regular schedule. Get 7-9 hours of sleep, or the amount recommended by your health care provider. SEEK MEDICAL CARE IF:   Your symptoms are not helped by medicine.  You have a headache that is different from the usual headache.  You have nausea or you vomit.  You have a fever. SEEK IMMEDIATE MEDICAL CARE IF:   Your headache becomes severe.  You have repeated vomiting.  You have a stiff neck.  You have a loss of vision.  You have problems with speech.  You have pain in the eye or ear.  You have muscular weakness or loss of muscle control.  You lose your balance or have trouble walking.  You feel faint or pass out.  You have confusion.   This information is not intended to replace advice given to you by your health  care provider. Make sure you discuss any questions you have with your health care provider.   Document Released: 06/27/2005 Document Revised: 03/18/2015 Document Reviewed: 10/20/2014 Elsevier Interactive Patient Education 2016 [employer]  Nonspecific Chest Pain It is often hard to find the cause of chest pain. There is always a chance that your pain could be related to something serious, such as a heart attack or a blood clot in your lungs. Chest pain can also  be caused by conditions that are not life-threatening. If you have chest pain, it is very important to follow up with your doctor.  HOME CARE  If you were prescribed an antibiotic medicine, finish it all even if you start to feel better.  Avoid any activities that cause chest pain.  Do not use any tobacco products, including cigarettes, chewing tobacco, or electronic cigarettes. If you need help quitting, ask your doctor.  Do not drink alcohol.  Take medicines only as told by your doctor.  Keep all follow-up visits as told by your doctor. This is important. This includes any further testing if your chest pain does not go away.  Your doctor may tell you to keep your head raised (elevated) while you sleep.  Make lifestyle changes as told by your doctor. These may include:  Getting regular exercise. Ask your doctor to suggest some activities that are safe for you.  Eating a heart-healthy diet. Your doctor or a diet specialist (dietitian) can help you to learn healthy eating options.  Maintaining a healthy weight.  Managing diabetes, if necessary.  Reducing stress. GET HELP IF:  Your chest pain does not go away, even after treatment.  You have a rash with blisters on your chest.  You have a fever. GET HELP RIGHT AWAY IF:  Your chest pain is worse.  You have an increasing cough, or you cough up blood.  You have severe belly (abdominal) pain.  You feel extremely weak.  You pass out (faint).  You have chills.  You have sudden, unexplained chest discomfort.  You have sudden, unexplained discomfort in your arms, back, neck, or jaw.  You have shortness of breath at any time.  You suddenly start to sweat, or your skin gets clammy.  You feel nauseous.  You vomit.  You suddenly feel light-headed or dizzy.  Your heart begins to beat quickly, or it feels like it is skipping beats. These symptoms may be an emergency. Do not wait to see if the symptoms will go away.  Get medical help right away. Call your local emergency services (911 in the [place] Do not drive yourself to the hospital.   This information is not intended to replace advice given to you by your health care provider. Make sure you discuss any questions you have with your health care provider.   Document Released: 12/14/2007 Document Revised: 07/18/2014 Document Reviewed: 01/31/2014 Elsevier Interactive Patient Education [employer].
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
## 2015-06-14 NOTE — ED Notes (Signed)
Pt presents to the ER with " really really bad headache and chest started hurting an hour ago". Pt states her headache is sharp and stabbing. Chest pain is described as heavy , constant and sharp chest pain. Pt took ibuprofen for headache symptoms. Pt takes 100 mg metoprolol daily for SVT.

## 2015-06-14 NOTE — ED Notes (Addendum)
Patient placed on NRB for headache.

## 2015-06-14 NOTE — ED Notes (Signed)
Dr. Stafford at bedside.  

## 2015-06-14 NOTE — ED Notes (Signed)
Patient transported to X-ray 

## 2015-06-15 MED ORDER — BUTALBITAL-APAP-CAFFEINE 50-325-40 MG PO TABS: 1.0000 | ORAL_TABLET | Freq: Four times a day (QID) | ORAL | Status: AC | PRN

## 2015-06-15 NOTE — ED Provider Notes (Signed)
-----------------------------------------   12:01 AM on 06/15/2015 -----------------------------------------   Blood pressure 132/83, pulse 79, temperature 98.1 F (36.7 C), temperature source Oral, resp. rate 19, height 5\' 11"  (1.803 m), weight 187 lb (84.823 kg), last menstrual period 06/01/2015, SpO2 98 %.  Assuming care from Dr. Joni Fears.  In short, Vanessa Thomas is a 34 y.o. female with a chief complaint of Chest Pain and Headache .  Refer to the original H&P for additional details.  The current plan of care is to follow-up the repeat troponin and evaluate the patient for resolution of symptoms.  Patient's repeat troponin is negative. The patient's headache is much improved. The patient reports that the Fioricet helped a lot so I will give her prescription for Fioricet and have her follow up as instructed by Dr. Reita Cliche.   Loney Hering, MD 06/15/15 Dyann Kief

## 2015-06-16 ENCOUNTER — Ambulatory Visit (INDEPENDENT_AMBULATORY_CARE_PROVIDER_SITE_OTHER): Payer: PRIVATE HEALTH INSURANCE | Admitting: Cardiology

## 2015-06-16 ENCOUNTER — Encounter: Payer: Self-pay | Admitting: Cardiology

## 2015-06-16 VITALS — BP 143/99 | HR 92 | Ht 71.0 in | Wt 205.0 lb

## 2015-06-16 DIAGNOSIS — R0602 Shortness of breath: Secondary | ICD-10-CM

## 2015-06-16 DIAGNOSIS — R002 Palpitations: Secondary | ICD-10-CM

## 2015-06-16 DIAGNOSIS — I77810 Thoracic aortic ectasia: Secondary | ICD-10-CM

## 2015-06-16 DIAGNOSIS — Q201 Double outlet right ventricle: Secondary | ICD-10-CM

## 2015-06-16 DIAGNOSIS — Z9889 Other specified postprocedural states: Secondary | ICD-10-CM | POA: Diagnosis not present

## 2015-06-16 DIAGNOSIS — I471 Supraventricular tachycardia, unspecified: Secondary | ICD-10-CM

## 2015-06-16 DIAGNOSIS — IMO0001 Reserved for inherently not codable concepts without codable children: Secondary | ICD-10-CM

## 2015-06-16 DIAGNOSIS — Z8774 Personal history of (corrected) congenital malformations of heart and circulatory system: Secondary | ICD-10-CM

## 2015-06-16 DIAGNOSIS — R030 Elevated blood-pressure reading, without diagnosis of hypertension: Secondary | ICD-10-CM

## 2015-06-16 MED ORDER — METOPROLOL SUCCINATE ER 100 MG PO TB24: 100.0000 mg | ORAL_TABLET | Freq: Two times a day (BID) | ORAL | Status: DC

## 2015-06-16 NOTE — Patient Instructions (Signed)
Your physician recommends that you schedule a follow-up appointment TO BE DETERMINED AFTER TESTING   Your physician recommends that you continue on your current medications as directed. Please refer to the Current Medication list given to you today.  Your physician has recommended that you wear an event monitor FOR 7 DAYS. Event monitors are medical devices that record the heart's electrical activity. Doctors most often [place] these monitors to diagnose arrhythmias. Arrhythmias are problems with the speed or rhythm of the heartbeat. The monitor is a small, portable device. You can wear one while you do your normal daily activities. This is usually used to diagnose what is causing palpitations/syncope (passing out).  Your physician has requested that you have an echocardiogram. Echocardiography is a painless test that uses sound waves to create images of your heart. It provides your doctor with information about the size and shape of your heart and how well your heart's chambers and valves are working. This procedure takes approximately one hour. There are no restrictions for this procedure.  Thank you for choosing [facility]!!
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
## 2015-06-22 ENCOUNTER — Ambulatory Visit (INDEPENDENT_AMBULATORY_CARE_PROVIDER_SITE_OTHER): Payer: PRIVATE HEALTH INSURANCE

## 2015-06-22 DIAGNOSIS — R002 Palpitations: Secondary | ICD-10-CM | POA: Diagnosis not present

## 2015-06-22 DIAGNOSIS — I471 Supraventricular tachycardia: Secondary | ICD-10-CM | POA: Diagnosis not present

## 2015-06-30 ENCOUNTER — Telehealth: Payer: Self-pay | Admitting: *Deleted

## 2015-06-30 NOTE — Telephone Encounter (Signed)
-----   Message from Satira Sark, MD sent at 06/29/2015  4:40 PM EST ----- Please see report. Sinus rhythm and sinus tachycardia predominate during the monitoring period, however there was one SVT event in the 160s that was difficult to characterize as sinus in origin. In light of her history of PSVT, and active symptoms on high-dose beta blocker, I would recommend that she see Dr. Caryl Comes again for follow-up evaluation. Meanwhile, will await the results of her echocardiogram for review.

## 2015-07-02 ENCOUNTER — Other Ambulatory Visit: Payer: PRIVATE HEALTH INSURANCE

## 2015-07-02 DIAGNOSIS — R0989 Other specified symptoms and signs involving the circulatory and respiratory systems: Secondary | ICD-10-CM

## 2015-07-22 ENCOUNTER — Encounter (HOSPITAL_COMMUNITY): Payer: Self-pay | Admitting: Cardiovascular Disease

## 2015-08-12 ENCOUNTER — Encounter: Payer: Self-pay | Admitting: *Deleted

## 2015-08-31 ENCOUNTER — Telehealth: Payer: Self-pay | Admitting: *Deleted

## 2015-08-31 DIAGNOSIS — R0602 Shortness of breath: Secondary | ICD-10-CM

## 2015-08-31 DIAGNOSIS — Z8774 Personal history of (corrected) congenital malformations of heart and circulatory system: Secondary | ICD-10-CM

## 2015-08-31 DIAGNOSIS — I471 Supraventricular tachycardia: Secondary | ICD-10-CM

## 2015-08-31 NOTE — Telephone Encounter (Addendum)
Fiance Vanessa Thomas informed and will have patient call office tomorrow to discuss whether or not she wants to proceed with EP referral and reschedule echo.

## 2015-08-31 NOTE — Telephone Encounter (Signed)
-----   Message from Satira Sark, MD sent at 06/29/2015  4:40 PM EST ----- Please see report. Sinus rhythm and sinus tachycardia predominate during the monitoring period, however there was one SVT event in the 160s that was difficult to characterize as sinus in origin. In light of her history of PSVT, and active symptoms on high-dose beta blocker, I would recommend that she see Dr. Caryl Comes again for follow-up evaluation. Meanwhile, will await the results of her echocardiogram for review.

## 2015-09-01 NOTE — Telephone Encounter (Signed)
-----   Message from Satira Sark, MD sent at 06/29/2015  4:40 PM EST ----- Please see report. Sinus rhythm and sinus tachycardia predominate during the monitoring period, however there was one SVT event in the 160s that was difficult to characterize as sinus in origin. In light of her history of PSVT, and active symptoms on high-dose beta blocker, I would recommend that she see Dr. Caryl Comes again for follow-up evaluation. Meanwhile, will await the results of her echocardiogram for review.

## 2015-09-01 NOTE — Addendum Note (Signed)
Addended by: Merlene Laughter on: 09/01/2015 01:06 PM   Modules accepted: Orders

## 2015-09-01 NOTE — Telephone Encounter (Signed)
Patient informed and verbalized understanding of plan. Patient wants to reschedule the echo also and says it is okay to leave a message on her vm with the appointment information for both appointments.

## 2015-09-03 NOTE — Telephone Encounter (Signed)
Patient informed and verbalized understanding of plan. 

## 2015-09-14 ENCOUNTER — Other Ambulatory Visit (HOSPITAL_COMMUNITY): Payer: PRIVATE HEALTH INSURANCE

## 2015-09-16 ENCOUNTER — Institutional Professional Consult (permissible substitution): Payer: PRIVATE HEALTH INSURANCE | Admitting: Internal Medicine

## 2015-09-21 ENCOUNTER — Encounter: Payer: Self-pay | Admitting: Internal Medicine

## 2016-01-19 ENCOUNTER — Encounter: Payer: Self-pay | Admitting: Internal Medicine

## 2016-01-22 ENCOUNTER — Emergency Department
Admission: EM | Admit: 2016-01-22 | Discharge: 2016-01-23 | Disposition: A | Discharge status: Home / Self Care | Payer: PRIVATE HEALTH INSURANCE | Attending: Emergency Medicine | Admitting: Emergency Medicine

## 2016-01-22 ENCOUNTER — Emergency Department: Payer: PRIVATE HEALTH INSURANCE

## 2016-01-22 DIAGNOSIS — R0789 Other chest pain: Secondary | ICD-10-CM

## 2016-01-22 DIAGNOSIS — Z79899 Other long term (current) drug therapy: Secondary | ICD-10-CM | POA: Insufficient documentation

## 2016-01-22 DIAGNOSIS — F17210 Nicotine dependence, cigarettes, uncomplicated: Secondary | ICD-10-CM | POA: Insufficient documentation

## 2016-01-22 DIAGNOSIS — Z7982 Long term (current) use of aspirin: Secondary | ICD-10-CM | POA: Insufficient documentation

## 2016-01-22 DIAGNOSIS — F410 Panic disorder [episodic paroxysmal anxiety] without agoraphobia: Secondary | ICD-10-CM | POA: Insufficient documentation

## 2016-01-22 DIAGNOSIS — R002 Palpitations: Secondary | ICD-10-CM

## 2016-01-22 LAB — CBC
HCT: 44.1 % (ref 35.0–47.0)
Hemoglobin: 15.5 g/dL (ref 12.0–16.0)
MCH: 29.4 pg (ref 26.0–34.0)
MCHC: 35.2 g/dL (ref 32.0–36.0)
MCV: 83.5 fL (ref 80.0–100.0)
PLATELETS: 216 10*3/uL (ref 150–440)
RBC: 5.28 MIL/uL — AB (ref 3.80–5.20)
RDW: 12.4 % (ref 11.5–14.5)
WBC: 10.9 10*3/uL (ref 3.6–11.0)

## 2016-01-22 NOTE — ED Notes (Signed)
Pt reports she developed a headache this morning when she woke up this morning. Pt reports this evening she developed pain to her mid back between her shoulder blade region that goes straight through to her chest. Pt reports large hx cardiac surgery and ablation for svt.
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
## 2016-01-23 LAB — BASIC METABOLIC PANEL
Anion gap: 5 (ref 5–15)
BUN: 8 mg/dL (ref 6–20)
CALCIUM: 9.4 mg/dL (ref 8.9–10.3)
CO2: 27 mmol/L (ref 22–32)
CREATININE: 0.86 mg/dL (ref 0.44–1.00)
Chloride: 106 mmol/L (ref 101–111)
GFR calc Af Amer: >60 mL/min (ref >60)
GLUCOSE: 91 mg/dL (ref 65–99)
Potassium: 3.3 mmol/L — ABNORMAL LOW (ref 3.5–5.1)
Sodium: 138 mmol/L (ref 135–145)

## 2016-01-23 LAB — FIBRIN DERIVATIVES D-DIMER (ARMC ONLY): Fibrin derivatives D-dimer (ARMC): 174 (ref 0–499)

## 2016-01-23 LAB — TROPONIN I: Troponin I: <0.03 ng/mL (ref <0.03)

## 2016-01-23 MED ORDER — LORAZEPAM 2 MG/ML IJ SOLN
1.0000 mg | Freq: Once | INTRAMUSCULAR | Status: AC
  Administered 2016-01-23: 1 mg via INTRAVENOUS
  Filled 2016-01-23: qty 1

## 2016-01-23 MED ORDER — MORPHINE SULFATE (PF) 4 MG/ML IV SOLN
4.0000 mg | Freq: Once | INTRAVENOUS | Status: AC
  Administered 2016-01-23: 4 mg via INTRAVENOUS

## 2016-01-23 MED ORDER — ONDANSETRON HCL 4 MG/2ML IJ SOLN
4.0000 mg | INTRAMUSCULAR | Status: AC
  Administered 2016-01-23: 4 mg via INTRAVENOUS

## 2016-01-23 NOTE — ED Notes (Signed)
Pt appears less anxious. Pt talking to mother on phone. Pt reports continued pain 4/10 to chest and back, but states is improved compared to arrival.

## 2016-01-23 NOTE — Discharge Instructions (Signed)
You have been seen in the Emergency Department (ED) today for chest pain.  As we have discussed todays test results are normal, and we feel it is likely that panic attacks may be causing (or at least exacerbating) your symptoms.  Please follow up with the recommended doctor as instructed above in these documents regarding todays emergent visit and your recent symptoms to discuss further management.  Continue to take your regular medications. If you are not doing so already, consider taking a daily baby aspirin (81 mg), at least until you follow up with your doctor.  Return to the Emergency Department (ED) if you experience any further chest pain/pressure/tightness, difficulty breathing, or sudden sweating, or other symptoms that concern you.   Chest Pain (Nonspecific) It is often hard to give a specific diagnosis for the cause of chest pain. There is always a chance that your pain could be related to something serious, such as a heart attack or a blood clot in the lungs. You need to follow up with your health care provider for further evaluation. CAUSES   Heartburn.  Pneumonia or bronchitis.  Anxiety or stress.  Inflammation around your heart (pericarditis) or lung (pleuritis or pleurisy).  A blood clot in the lung.  A collapsed lung (pneumothorax). It can develop suddenly on its own (spontaneous pneumothorax) or from trauma to the chest.  Shingles infection (herpes zoster virus). The chest wall is composed of bones, muscles, and cartilage. Any of these can be the source of the pain.  The bones can be bruised by injury.  The muscles or cartilage can be strained by coughing or overwork.  The cartilage can be affected by inflammation and become sore (costochondritis). DIAGNOSIS  Lab tests or other studies may be needed to find the cause of your pain. Your health care provider may have you take a test called an ambulatory electrocardiogram (ECG). An ECG records your heartbeat patterns  over a 24-hour period. You may also have other tests, such as:  Transthoracic echocardiogram (TTE). During echocardiography, sound waves are used to evaluate how blood flows through your heart.  Transesophageal echocardiogram (TEE).  Cardiac monitoring. This allows your health care provider to monitor your heart rate and rhythm in real time.  Holter monitor. This is a portable device that records your heartbeat and can help diagnose heart arrhythmias. It allows your health care provider to track your heart activity for several days, if needed.  Stress tests by exercise or by giving medicine that makes the heart beat faster. TREATMENT   Treatment depends on what may be causing your chest pain. Treatment may include:  Acid blockers for heartburn.  Anti-inflammatory medicine.  Pain medicine for inflammatory conditions.  Antibiotics if an infection is present.  You may be advised to change lifestyle habits. This includes stopping smoking and avoiding alcohol, caffeine, and chocolate.  You may be advised to keep your head raised (elevated) when sleeping. This reduces the chance of acid going backward from your stomach into your esophagus. Most of the time, nonspecific chest pain will improve within 2-3 days with rest and mild pain medicine.  HOME CARE INSTRUCTIONS   If antibiotics were prescribed, take them as directed. Finish them even if you start to feel better.  For the next few days, avoid physical activities that bring on chest pain. Continue physical activities as directed.  Do not use any tobacco products, including cigarettes, chewing tobacco, or electronic cigarettes.  Avoid drinking alcohol.  Only take medicine as directed by your  health care provider.  Follow your health care provider's suggestions for further testing if your chest pain does not go away.  Keep any follow-up appointments you made. If you do not go to an appointment, you could develop lasting (chronic)  problems with pain. If there is any problem keeping an appointment, call to reschedule. SEEK MEDICAL CARE IF:   Your chest pain does not go away, even after treatment.  You have a rash with blisters on your chest.  You have a fever. SEEK IMMEDIATE MEDICAL CARE IF:   You have increased chest pain or pain that spreads to your arm, neck, jaw, back, or abdomen.  You have shortness of breath.  You have an increasing cough, or you cough up blood.  You have severe back or abdominal pain.  You feel nauseous or vomit.  You have severe weakness.  You faint.  You have chills. This is an emergency. Do not wait to see if the pain will go away. Get medical help at once. Call your local emergency services (911 in [place] Do not drive yourself to the hospital. MAKE SURE YOU:   Understand these instructions.  Will watch your condition.  Will get help right away if you are not doing well or get worse. Document Released: 04/06/2005 Document Revised: 07/02/2013 Document Reviewed: 01/31/2008 [facility] Patient Information 2015 [place], [place]. This information is not intended to replace advice given to you by your health care provider. Make sure you discuss any questions you have with your health care provider.   Panic Attacks Panic attacks are sudden, short-livedsurges of severe anxiety, fear, or discomfort. They may occur for no reason when you are relaxed, when you are anxious, or when you are sleeping. Panic attacks may occur for a number of reasons:   Healthy people occasionally have panic attacks in extreme, life-threatening situations, such as war or natural disasters. Normal anxiety is a protective mechanism of the body that helps [place] react to danger (fight or flight response).  Panic attacks are often seen with anxiety disorders, such as panic disorder, social anxiety disorder, generalized anxiety disorder, and phobias. Anxiety disorders cause excessive or uncontrollable anxiety. They may  interfere with your relationships or other life activities.  Panic attacks are sometimes seen with other mental illnesses, such as depression and posttraumatic stress disorder.  Certain medical conditions, prescription medicines, and drugs of abuse can cause panic attacks. SYMPTOMS  Panic attacks start suddenly, peak within 20 minutes, and are accompanied by four or more of the following symptoms:  Pounding heart or fast heart rate (palpitations).  Sweating.  Trembling or shaking.  Shortness of breath or feeling smothered.  Feeling choked.  Chest pain or discomfort.  Nausea or strange feeling in your stomach.  Dizziness, light-headedness, or feeling like you will faint.  Chills or hot flushes.  Numbness or tingling in your lips or hands and feet.  Feeling that things are not real or feeling that you are not yourself.  Fear of losing control or going crazy.  Fear of dying. Some of these symptoms can mimic serious medical conditions. For example, you may think you are having a heart attack. Although panic attacks can be very scary, they are not life threatening. DIAGNOSIS  Panic attacks are diagnosed through an assessment by your health care provider. Your health care provider will ask questions about your symptoms, such as where and when they occurred. Your health care provider will also ask about your medical history and use of alcohol and drugs,  including prescription medicines. Your health care provider may order blood tests or other studies to rule out a serious medical condition. Your health care provider may refer you to a mental health professional for further evaluation. TREATMENT   Most healthy people who have one or two panic attacks in an extreme, life-threatening situation will not require treatment.  The treatment for panic attacks associated with anxiety disorders or other mental illness typically involves counseling with a mental health professional, medicine, or  a combination of both. Your health care provider will help determine what treatment is best for you.  Panic attacks due to physical illness usually go away with treatment of the illness. If prescription medicine is causing panic attacks, talk with your health care provider about stopping the medicine, decreasing the dose, or substituting another medicine.  Panic attacks due to alcohol or drug abuse go away with abstinence. Some adults need professional help in order to stop drinking or using drugs. HOME CARE INSTRUCTIONS   Take all medicines as directed by your health care provider.   Schedule and attend follow-up visits as directed by your health care provider. It is important to keep all your appointments. SEEK MEDICAL CARE IF:  You are not able to take your medicines as prescribed.  Your symptoms do not improve or get worse. SEEK IMMEDIATE MEDICAL CARE IF:   You experience panic attack symptoms that are different than your usual symptoms.  You have serious thoughts about hurting yourself or others.  You are taking medicine for panic attacks and have a serious side effect. MAKE SURE YOU:  Understand these instructions.  Will watch your condition.  Will get help right away if you are not doing well or get worse.   This information is not intended to replace advice given to you by your health care provider. Make sure you discuss any questions you have with your health care provider.   Document Released: 06/27/2005 Document Revised: 07/02/2013 Document Reviewed: 02/08/2013 Elsevier Interactive Patient Education [employer].

## 2016-01-23 NOTE — ED Notes (Signed)
Pt states pain persists but is improved. Pt continues to appear uncomfortable. Pt agrees to requesting repeat pain meds from md. Will notify md.

## 2016-07-22 DIAGNOSIS — R10A1 Flank pain, right side: Secondary | ICD-10-CM

## 2016-07-22 DIAGNOSIS — R109 Unspecified abdominal pain: Secondary | ICD-10-CM | POA: Insufficient documentation

## 2016-07-22 HISTORY — DX: Flank pain, right side: R10.A1

## 2016-08-02 DIAGNOSIS — R030 Elevated blood-pressure reading, without diagnosis of hypertension: Secondary | ICD-10-CM | POA: Insufficient documentation

## 2016-08-02 DIAGNOSIS — N281 Cyst of kidney, acquired: Secondary | ICD-10-CM | POA: Insufficient documentation

## 2016-08-02 HISTORY — DX: Cyst of kidney, acquired: N28.1

## 2016-09-14 ENCOUNTER — Encounter (HOSPITAL_COMMUNITY): Payer: Self-pay | Admitting: Emergency Medicine

## 2016-09-14 ENCOUNTER — Emergency Department (HOSPITAL_COMMUNITY): Payer: PRIVATE HEALTH INSURANCE

## 2016-09-14 ENCOUNTER — Emergency Department (HOSPITAL_COMMUNITY)
Admission: EM | Admit: 2016-09-14 | Discharge: 2016-09-14 | Disposition: A | Discharge status: Home / Self Care | Payer: PRIVATE HEALTH INSURANCE | Attending: Emergency Medicine | Admitting: Emergency Medicine

## 2016-09-14 DIAGNOSIS — R0602 Shortness of breath: Secondary | ICD-10-CM | POA: Diagnosis not present

## 2016-09-14 DIAGNOSIS — R0789 Other chest pain: Secondary | ICD-10-CM | POA: Diagnosis not present

## 2016-09-14 DIAGNOSIS — R002 Palpitations: Secondary | ICD-10-CM | POA: Diagnosis not present

## 2016-09-14 DIAGNOSIS — F17210 Nicotine dependence, cigarettes, uncomplicated: Secondary | ICD-10-CM | POA: Diagnosis not present

## 2016-09-14 DIAGNOSIS — R072 Precordial pain: Secondary | ICD-10-CM | POA: Diagnosis present

## 2016-09-14 LAB — CBC WITH DIFFERENTIAL/PLATELET
Basophils Absolute: 0 10*3/uL (ref 0.0–0.1)
Basophils Relative: 0 %
EOS ABS: 0.1 10*3/uL (ref 0.0–0.7)
EOS PCT: 1 %
HCT: 46.5 % — ABNORMAL HIGH (ref 36.0–46.0)
Hemoglobin: 15.9 g/dL — ABNORMAL HIGH (ref 12.0–15.0)
LYMPHS ABS: 1.6 10*3/uL (ref 0.7–4.0)
LYMPHS PCT: 21 %
MCH: 29 pg (ref 26.0–34.0)
MCHC: 34.2 g/dL (ref 30.0–36.0)
MCV: 84.9 fL (ref 78.0–100.0)
MONOS PCT: 7 %
Monocytes Absolute: 0.5 10*3/uL (ref 0.1–1.0)
Neutro Abs: 5.5 10*3/uL (ref 1.7–7.7)
Neutrophils Relative %: 71 %
PLATELETS: 226 10*3/uL (ref 150–400)
RBC: 5.48 MIL/uL — AB (ref 3.87–5.11)
RDW: 12.4 % (ref 11.5–15.5)
WBC: 7.7 10*3/uL (ref 4.0–10.5)

## 2016-09-14 LAB — BASIC METABOLIC PANEL
Anion gap: 11 (ref 5–15)
BUN: 8 mg/dL (ref 6–20)
CO2: 25 mmol/L (ref 22–32)
CREATININE: 0.89 mg/dL (ref 0.44–1.00)
Calcium: 9.6 mg/dL (ref 8.9–10.3)
Chloride: 104 mmol/L (ref 101–111)
GFR calc Af Amer: >60 mL/min (ref >60)
GLUCOSE: 82 mg/dL (ref 65–99)
POTASSIUM: 3.4 mmol/L — AB (ref 3.5–5.1)
SODIUM: 140 mmol/L (ref 135–145)

## 2016-09-14 LAB — D-DIMER, QUANTITATIVE: D-Dimer, Quant: <0.27 ug/mL-FEU (ref 0.00–0.50)

## 2016-09-14 LAB — I-STAT BETA HCG BLOOD, ED (MC, WL, AP ONLY)

## 2016-09-14 LAB — I-STAT TROPONIN, ED
TROPONIN I, POC: 0 ng/mL (ref 0.00–0.08)
Troponin i, poc: 0 ng/mL (ref 0.00–0.08)

## 2016-09-14 LAB — BRAIN NATRIURETIC PEPTIDE: B NATRIURETIC PEPTIDE 5: 20.2 pg/mL (ref 0.0–100.0)

## 2016-09-14 MED ORDER — METOCLOPRAMIDE HCL 5 MG/ML IJ SOLN
10.0000 mg | Freq: Once | INTRAMUSCULAR | Status: AC
  Administered 2016-09-14: 10 mg via INTRAVENOUS
  Filled 2016-09-14: qty 2

## 2016-09-14 MED ORDER — MORPHINE SULFATE (PF) 4 MG/ML IV SOLN
4.0000 mg | Freq: Once | INTRAVENOUS | Status: AC
  Administered 2016-09-14: 4 mg via INTRAVENOUS
  Filled 2016-09-14: qty 1

## 2016-09-14 MED ORDER — KETOROLAC TROMETHAMINE 30 MG/ML IJ SOLN
30.0000 mg | Freq: Once | INTRAMUSCULAR | Status: AC
  Administered 2016-09-14: 30 mg via INTRAVENOUS
  Filled 2016-09-14: qty 1

## 2016-09-14 MED ORDER — ONDANSETRON HCL 4 MG/2ML IJ SOLN
4.0000 mg | Freq: Once | INTRAMUSCULAR | Status: AC
  Administered 2016-09-14: 4 mg via INTRAVENOUS
  Filled 2016-09-14: qty 2

## 2016-09-14 NOTE — Discharge Instructions (Signed)
Your chest pain work-up today is a reassuring. Please call your cardiologist for close follow-up Return for worsening symptoms, including worsening pain, passing out, difficulty breathing or any other symptoms concerning to you.

## 2016-09-14 NOTE — ED Notes (Signed)
Pt reporting she would like to be discharged because she needs to go pick up her child. Dr. Oleta Mouse informed and went to bedside to speak with patient.
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
## 2016-09-14 NOTE — ED Triage Notes (Signed)
Pt states she has a history of svt and congential heart disorder. Yesterday she started having a heart rate in the 140s and a pain in the middle of her back. Pt states this morning she is still having pain in her back and feels lightheaded.

## 2016-09-14 NOTE — ED Notes (Signed)
Patient transported to X-ray 

## 2016-09-14 NOTE — ED Notes (Signed)
Pt is back from xray and placed back on monitor.

## 2016-09-14 NOTE — ED Notes (Signed)
Pt given cup of gingerale °

## 2016-09-14 NOTE — ED Notes (Signed)
Pt A&OX4 ambulatory at d/c with steady gait, NAD

## 2016-09-14 NOTE — ED Notes (Signed)
Ginger ale is not making patient nauseous at this time

## 2017-05-04 ENCOUNTER — Encounter (HOSPITAL_COMMUNITY): Payer: Self-pay | Admitting: *Deleted

## 2017-05-04 ENCOUNTER — Emergency Department (HOSPITAL_COMMUNITY): Payer: BLUE CROSS/BLUE SHIELD

## 2017-05-04 ENCOUNTER — Emergency Department (HOSPITAL_COMMUNITY)
Admission: EM | Admit: 2017-05-04 | Discharge: 2017-05-04 | Disposition: A | Discharge status: Home / Self Care | Payer: BLUE CROSS/BLUE SHIELD | Attending: Emergency Medicine | Admitting: Emergency Medicine

## 2017-05-04 DIAGNOSIS — R3915 Urgency of urination: Secondary | ICD-10-CM | POA: Diagnosis not present

## 2017-05-04 DIAGNOSIS — R112 Nausea with vomiting, unspecified: Secondary | ICD-10-CM | POA: Insufficient documentation

## 2017-05-04 DIAGNOSIS — F17210 Nicotine dependence, cigarettes, uncomplicated: Secondary | ICD-10-CM | POA: Insufficient documentation

## 2017-05-04 DIAGNOSIS — N200 Calculus of kidney: Secondary | ICD-10-CM | POA: Diagnosis not present

## 2017-05-04 DIAGNOSIS — Z79899 Other long term (current) drug therapy: Secondary | ICD-10-CM | POA: Insufficient documentation

## 2017-05-04 DIAGNOSIS — R1032 Left lower quadrant pain: Secondary | ICD-10-CM | POA: Diagnosis present

## 2017-05-04 LAB — CBC
HEMATOCRIT: 44.7 % (ref 36.0–46.0)
HEMOGLOBIN: 15.2 g/dL — AB (ref 12.0–15.0)
MCH: 29.7 pg (ref 26.0–34.0)
MCHC: 34 g/dL (ref 30.0–36.0)
MCV: 87.5 fL (ref 78.0–100.0)
PLATELETS: 198 10*3/uL (ref 150–400)
RBC: 5.11 MIL/uL (ref 3.87–5.11)
RDW: 12.6 % (ref 11.5–15.5)
WBC: 7.9 10*3/uL (ref 4.0–10.5)

## 2017-05-04 LAB — BASIC METABOLIC PANEL
ANION GAP: 9 (ref 5–15)
BUN: 9 mg/dL (ref 6–20)
CHLORIDE: 104 mmol/L (ref 101–111)
CO2: 23 mmol/L (ref 22–32)
Calcium: 9.1 mg/dL (ref 8.9–10.3)
Creatinine, Ser: 0.85 mg/dL (ref 0.44–1.00)
GFR calc non Af Amer: >60 mL/min (ref >60)
GLUCOSE: 86 mg/dL (ref 65–99)
POTASSIUM: 3.7 mmol/L (ref 3.5–5.1)
Sodium: 136 mmol/L (ref 135–145)

## 2017-05-04 LAB — URINALYSIS, ROUTINE W REFLEX MICROSCOPIC
BILIRUBIN URINE: NEGATIVE
Glucose, UA: NEGATIVE mg/dL
Ketones, ur: NEGATIVE mg/dL
NITRITE: NEGATIVE
PH: 6 (ref 5.0–8.0)
Protein, ur: NEGATIVE mg/dL
SPECIFIC GRAVITY, URINE: 1.012 (ref 1.005–1.030)

## 2017-05-04 LAB — I-STAT BETA HCG BLOOD, ED (MC, WL, AP ONLY)

## 2017-05-04 MED ORDER — SODIUM CHLORIDE 0.9 % IV BOLUS (SEPSIS)
1000.0000 mL | Freq: Once | INTRAVENOUS | Status: AC
  Administered 2017-05-04: 1000 mL via INTRAVENOUS

## 2017-05-04 MED ORDER — ONDANSETRON 4 MG PO TBDP: 4.0000 mg | ORAL_TABLET | Freq: Three times a day (TID) | ORAL | 0 refills | Status: DC | PRN

## 2017-05-04 MED ORDER — NAPROXEN 375 MG PO TABS: 375.0000 mg | ORAL_TABLET | Freq: Two times a day (BID) | ORAL | 0 refills | Status: DC

## 2017-05-04 MED ORDER — TAMSULOSIN HCL 0.4 MG PO CAPS: 0.4000 mg | ORAL_CAPSULE | Freq: Two times a day (BID) | ORAL | 0 refills | Status: DC

## 2017-05-04 MED ORDER — KETOROLAC TROMETHAMINE 30 MG/ML IJ SOLN
15.0000 mg | Freq: Once | INTRAMUSCULAR | Status: AC
  Administered 2017-05-04: 15 mg via INTRAVENOUS
  Filled 2017-05-04: qty 1

## 2017-05-04 MED ORDER — OXYCODONE-ACETAMINOPHEN 5-325 MG PO TABS: 1.0000 | ORAL_TABLET | Freq: Four times a day (QID) | ORAL | 0 refills | Status: DC | PRN

## 2017-05-04 MED ORDER — ONDANSETRON HCL 4 MG/2ML IJ SOLN
4.0000 mg | Freq: Once | INTRAMUSCULAR | Status: AC
  Administered 2017-05-04: 4 mg via INTRAVENOUS
  Filled 2017-05-04: qty 2

## 2017-05-04 MED ORDER — MORPHINE SULFATE (PF) 4 MG/ML IV SOLN
4.0000 mg | Freq: Once | INTRAVENOUS | Status: AC
  Administered 2017-05-04: 4 mg via INTRAVENOUS
  Filled 2017-05-04: qty 1
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
## 2017-05-04 NOTE — ED Triage Notes (Signed)
To ED for eval of left flank and abd pain since last pm. States she has hx of UTI and kidney stones - this feels similar. She had Cipro at home and started taking last pm. Tearful. Vomiting this am. Pain had intensified today.

## 2017-07-19 ENCOUNTER — Ambulatory Visit (AMBULATORY_SURGERY_CENTER): Payer: Self-pay | Admitting: *Deleted

## 2017-07-19 ENCOUNTER — Other Ambulatory Visit: Payer: Self-pay

## 2017-07-19 VITALS — Ht 71.0 in | Wt 210.0 lb

## 2017-07-19 DIAGNOSIS — Z86010 Personal history of colonic polyps: Secondary | ICD-10-CM

## 2017-07-19 NOTE — Progress Notes (Signed)
Patient denies any allergies to eggs or soy. Patient denies any problems with anesthesia/sedation. Patient denies any oxygen use at home. Patient denies taking any diet/weight loss medications or blood thinners. EMMI education assisgned to patient on colonoscopy, this was explained and instructions given to patient. During PV I Spoke with Dr.Gessner per pt's request due to her having rectal bleeding almost every time she has a BM, BRB and a lot in the toilet water,rectal pain, hx fissure repair in 2012. Dr.Gessner states colonoscopy is appropriate at this time. Pt notified. Sent Dr.Gessner flag about this pt. Per his request.

## 2017-07-20 ENCOUNTER — Encounter: Payer: Self-pay | Admitting: Internal Medicine

## 2017-07-26 ENCOUNTER — Ambulatory Visit (AMBULATORY_SURGERY_CENTER): Payer: BLUE CROSS/BLUE SHIELD | Admitting: Internal Medicine

## 2017-07-26 ENCOUNTER — Encounter: Payer: Self-pay | Admitting: Internal Medicine

## 2017-07-26 ENCOUNTER — Other Ambulatory Visit: Payer: Self-pay

## 2017-07-26 VITALS — BP 122/76 | HR 91 | Temp 98.6°F | Resp 16 | Ht 71.0 in | Wt 210.0 lb

## 2017-07-26 DIAGNOSIS — Z86010 Personal history of colonic polyps: Secondary | ICD-10-CM

## 2017-07-26 MED ORDER — HYDROCORTISONE 2.5 % RE CREA: 1.0000 "application " | TOPICAL_CREAM | Freq: Two times a day (BID) | RECTAL | 1 refills | Status: DC | PRN

## 2017-07-26 MED ORDER — ALOSETRON HCL 0.5 MG PO TABS: 0.5000 mg | ORAL_TABLET | Freq: Two times a day (BID) | ORAL | 0 refills | Status: DC

## 2017-07-26 MED ORDER — SODIUM CHLORIDE 0.9 % IV SOLN: 500.0000 mL | Freq: Once | INTRAVENOUS | Status: DC

## 2017-07-26 NOTE — Progress Notes (Signed)
Report given to PACU, vss 

## 2017-07-26 NOTE — Progress Notes (Signed)
Patient consents to observer being present for procedure. Pt's states no medical or surgical changes since previsit or office visit. 

## 2017-07-26 NOTE — Patient Instructions (Addendum)
No polyps today.  I think you can wait 10 years for next routine colonoscopy.  I am prescribing hydrocortisone cream for the hemorrhoids - did not see a fissure today. As far as the diarrhea and IBS - I am prescribing alosetron (Lotronex) to see if that helps. If it causes abdominal pain and constipation - stop it.  Please make an appointment to see me - call soon and schedule.  If the alosetron helps and you need a refill have pharmacy call. If it is not helping you can try 2 tablets at a time. If it works should work soon.  I appreciate the opportunity to care for you. Gatha Mayer, MD, FACG  YOU HAD AN ENDOSCOPIC PROCEDURE TODAY AT [facility]:   Refer to the procedure report that was given to you for any specific questions about what was found during the examination.  If the procedure report does not answer your questions, please call your gastroenterologist to clarify.  If you requested that your care partner not be given the details of your procedure findings, then the procedure report has been included in a sealed envelope for you to review at your convenience later.  YOU SHOULD EXPECT: Some feelings of bloating in the abdomen. Passage of more gas than usual.  Walking can help get rid of the air that was put into your GI tract during the procedure and reduce the bloating. If you had a lower endoscopy (such as a colonoscopy or flexible sigmoidoscopy) you may notice spotting of blood in your stool or on the toilet paper. If you underwent a bowel prep for your procedure, you may not have a normal bowel movement for a few days.  Please Note:  You might notice some irritation and congestion in your nose or some drainage.  This is from the oxygen used during your procedure.  There is no need for concern and it should clear up in a day or so.  SYMPTOMS TO REPORT IMMEDIATELY:   Following lower endoscopy (colonoscopy or flexible sigmoidoscopy):  Excessive amounts  of blood in the stool  Significant tenderness or worsening of abdominal pains  Swelling of the abdomen that is new, acute  Fever of 100F or higher   For urgent or emergent issues, a gastroenterologist can be reached at any hour by calling [phone].   DIET:  We do recommend a small meal at first, but then you may proceed to your regular diet.  Drink plenty of fluids but you should avoid alcoholic beverages for 24 hours.  ACTIVITY:  You should plan to take it easy for the rest of today and you should NOT DRIVE or use heavy machinery until tomorrow (because of the sedation medicines used during the test).    FOLLOW UP: Our staff will call the number listed on your records the next business day following your procedure to check on you and address any questions or concerns that you may have regarding the information given to you following your procedure. If we do not reach you, we will leave a message.  However, if you are feeling well and you are not experiencing any problems, there is no need to return our call.  We will assume that you have returned to your regular daily activities without incident.  If any biopsies were taken you will be contacted by phone or by letter within the next 1-3 weeks.  Please call us at [phone] if you have not heard about the  biopsies in 3 weeks.    SIGNATURES/CONFIDENTIALITY: You and/or your care partner have signed paperwork which will be entered into your electronic medical record.  These signatures attest to the fact that that the information above on your After Visit Summary has been reviewed and is understood.  Full responsibility of the confidentiality of this discharge information lies with you and/or your care-partner.

## 2017-07-26 NOTE — Op Note (Signed)
[facility] Patient Name: Vanessa Thomas Procedure Date: 07/26/2017 11:46 AM MRN: [id number] Endoscopist: Gatha Mayer , MD Age: 37 Referring MD:  Date of Birth: 02/03/1981 Gender: Female Account #: [id number] Procedure:                Colonoscopy Indications:              Surveillance: Personal history of adenomatous                            polyps on last colonoscopy > 5 years ago Medicines:                Propofol per Anesthesia, Monitored Anesthesia Care Procedure:                Pre-Anesthesia Assessment:                           - Prior to the procedure, a History and Physical                            was performed, and patient medications and                            allergies were reviewed. The patient's tolerance of                            previous anesthesia was also reviewed. The risks                            and benefits of the procedure and the sedation                            options and risks were discussed with the patient.                            All questions were answered, and informed consent                            was obtained. Prior Anticoagulants: The patient has                            taken no previous anticoagulant or antiplatelet                            agents. ASA Grade Assessment: II - A patient with                            mild systemic disease. After reviewing the risks                            and benefits, the patient was deemed in                            satisfactory condition to undergo the procedure.  After obtaining informed consent, the colonoscope                            was passed under direct vision. Throughout the                            procedure, the patient's blood pressure, pulse, and                            oxygen saturations were monitored continuously. The                            Model PCF-H190DL [id number]) scope was introduced     through the anus and advanced to the the terminal                            ileum, with identification of the appendiceal                            orifice and IC valve. The colonoscopy was performed                            without difficulty. The patient tolerated the                            procedure well. The quality of the bowel                            preparation was excellent. The terminal ileum,                            ileocecal valve, appendiceal orifice, and rectum                            were photographed. The bowel preparation used was                            Miralax. Scope In: 11:54:10 AM Scope Out: 12:04:39 PM Scope Withdrawal Time: 0 hours 8 minutes 10 seconds  Total Procedure Duration: 0 hours 10 minutes 29 seconds  Findings:                 The perianal and digital rectal examinations were                            normal.                           External and internal hemorrhoids were found during                            retroflexion.                           The terminal ileum appeared normal.  The exam was otherwise without abnormality on                            direct and retroflexion views. Complications:            No immediate complications. Estimated Blood Loss:     Estimated blood loss: none. Impression:               - External and internal hemorrhoids.                           - The examined portion of the ileum was normal.                           - The examination was otherwise normal on direct                            and retroflexion views.                           - No specimens collected.                           - Personal history of colonic polyp - 2 mm adenoma                            2012. Recommendation:           - Patient has a contact number available for                            emergencies. The signs and symptoms of potential                            delayed complications were  discussed with the                            patient. Return to normal activities tomorrow.                            Written discharge instructions were provided to the                            patient.                           - Resume previous diet.                           - Continue present medications.                           - Repeat colonoscopy in 10 years.                           - Rxing alosetron 0.5 mg bid for IBS D today  Rxing Hydrocortisone 2.5% rectal cream for                            hemorrhoids                           she is to call and make an Office visit appointment                            for follow-up Gatha Mayer, MD 07/26/2017 12:14:54 PM This report has been signed electronically.

## 2017-07-27 ENCOUNTER — Telehealth: Payer: Self-pay

## 2017-07-27 NOTE — Telephone Encounter (Signed)
Left message on answering machine. 

## 2017-07-27 NOTE — Telephone Encounter (Signed)
Left message

## 2017-09-18 DIAGNOSIS — R509 Fever, unspecified: Secondary | ICD-10-CM | POA: Diagnosis not present

## 2017-09-18 DIAGNOSIS — B349 Viral infection, unspecified: Secondary | ICD-10-CM | POA: Diagnosis not present

## 2017-11-02 DIAGNOSIS — N200 Calculus of kidney: Secondary | ICD-10-CM | POA: Diagnosis not present

## 2017-11-02 DIAGNOSIS — R109 Unspecified abdominal pain: Secondary | ICD-10-CM | POA: Diagnosis not present

## 2017-11-16 DIAGNOSIS — M5412 Radiculopathy, cervical region: Secondary | ICD-10-CM | POA: Diagnosis not present

## 2017-11-16 DIAGNOSIS — M5030 Other cervical disc degeneration, unspecified cervical region: Secondary | ICD-10-CM | POA: Diagnosis not present

## 2017-11-21 ENCOUNTER — Emergency Department
Admission: EM | Admit: 2017-11-21 | Discharge: 2017-11-21 | Disposition: A | Discharge status: Other Acute Inpatient Hospital | Payer: BLUE CROSS/BLUE SHIELD | Attending: Emergency Medicine | Admitting: Emergency Medicine

## 2017-11-21 ENCOUNTER — Encounter: Payer: Self-pay | Admitting: Emergency Medicine

## 2017-11-21 ENCOUNTER — Other Ambulatory Visit: Payer: Self-pay

## 2017-11-21 DIAGNOSIS — Z79899 Other long term (current) drug therapy: Secondary | ICD-10-CM | POA: Diagnosis not present

## 2017-11-21 DIAGNOSIS — T2121XA Burn of second degree of chest wall, initial encounter: Secondary | ICD-10-CM | POA: Diagnosis not present

## 2017-11-21 DIAGNOSIS — L559 Sunburn, unspecified: Secondary | ICD-10-CM

## 2017-11-21 DIAGNOSIS — L551 Sunburn of second degree: Secondary | ICD-10-CM | POA: Insufficient documentation

## 2017-11-21 DIAGNOSIS — T24202A Burn of second degree of unspecified site of left lower limb, except ankle and foot, initial encounter: Secondary | ICD-10-CM | POA: Diagnosis not present

## 2017-11-21 DIAGNOSIS — T24201A Burn of second degree of unspecified site of right lower limb, except ankle and foot, initial encounter: Secondary | ICD-10-CM | POA: Diagnosis not present

## 2017-11-21 DIAGNOSIS — F17210 Nicotine dependence, cigarettes, uncomplicated: Secondary | ICD-10-CM | POA: Diagnosis not present

## 2017-11-21 DIAGNOSIS — T3144 Burns involving 40-49% of body surface with 40-49% third degree burns: Secondary | ICD-10-CM | POA: Diagnosis not present

## 2017-11-21 DIAGNOSIS — T2022XA Burn of second degree of lip(s), initial encounter: Secondary | ICD-10-CM | POA: Diagnosis not present

## 2017-11-21 LAB — COMPREHENSIVE METABOLIC PANEL
ALT: 11 U/L — ABNORMAL LOW (ref 14–54)
AST: 21 U/L (ref 15–41)
Albumin: 3.7 g/dL (ref 3.5–5.0)
Alkaline Phosphatase: 62 U/L (ref 38–126)
Anion gap: 7 (ref 5–15)
BUN: 11 mg/dL (ref 6–20)
CO2: 27 mmol/L (ref 22–32)
Calcium: 9.1 mg/dL (ref 8.9–10.3)
Chloride: 104 mmol/L (ref 101–111)
Creatinine, Ser: 0.88 mg/dL (ref 0.44–1.00)
GFR calc Af Amer: >60 mL/min (ref >60)
GFR calc non Af Amer: >60 mL/min (ref >60)
Glucose, Bld: 96 mg/dL (ref 65–99)
Potassium: 3.6 mmol/L (ref 3.5–5.1)
Sodium: 138 mmol/L (ref 135–145)
Total Bilirubin: 0.4 mg/dL (ref 0.3–1.2)
Total Protein: 7.3 g/dL (ref 6.5–8.1)

## 2017-11-21 LAB — CBC WITH DIFFERENTIAL/PLATELET
Basophils Absolute: 0 10*3/uL (ref 0–0.1)
Basophils Relative: 0 %
Eosinophils Absolute: 0 10*3/uL (ref 0–0.7)
Eosinophils Relative: 0 %
HCT: 45.6 % (ref 35.0–47.0)
Hemoglobin: 15.2 g/dL (ref 12.0–16.0)
Lymphocytes Relative: 13 %
Lymphs Abs: 1.4 10*3/uL (ref 1.0–3.6)
MCH: 28.9 pg (ref 26.0–34.0)
MCHC: 33.2 g/dL (ref 32.0–36.0)
MCV: 86.9 fL (ref 80.0–100.0)
Monocytes Absolute: 0.4 10*3/uL (ref 0.2–0.9)
Monocytes Relative: 4 %
Neutro Abs: 8.4 10*3/uL — ABNORMAL HIGH (ref 1.4–6.5)
Neutrophils Relative %: 83 %
Platelets: 244 10*3/uL (ref 150–440)
RBC: 5.25 MIL/uL — ABNORMAL HIGH (ref 3.80–5.20)
RDW: 13.1 % (ref 11.5–14.5)
WBC: 10.2 10*3/uL (ref 3.6–11.0)

## 2017-11-21 MED ORDER — HYDROMORPHONE HCL 1 MG/ML IJ SOLN
1.0000 mg | Freq: Once | INTRAMUSCULAR | Status: AC
  Administered 2017-11-21: 1 mg via INTRAVENOUS
  Filled 2017-11-21: qty 1

## 2017-11-21 MED ORDER — MORPHINE SULFATE (PF) 4 MG/ML IV SOLN
4.0000 mg | Freq: Once | INTRAVENOUS | Status: AC
  Administered 2017-11-21: 4 mg via INTRAVENOUS
  Filled 2017-11-21: qty 1

## 2017-11-21 MED ORDER — SODIUM CHLORIDE 0.9 % IV SOLN: 8.0000 mg | Freq: Once | INTRAVENOUS | Status: DC

## 2017-11-21 MED ORDER — LACTATED RINGERS IV SOLN
INTRAVENOUS | Status: DC
  Administered 2017-11-21: 22:00:00 via INTRAVENOUS

## 2017-11-21 MED ORDER — ONDANSETRON HCL 4 MG/2ML IJ SOLN
INTRAMUSCULAR | Status: AC
Start: 2017-11-21 — End: 2017-11-21
  Administered 2017-11-21: 4 mg
  Filled 2017-11-21: qty 2

## 2017-11-21 MED ORDER — SODIUM CHLORIDE 0.9 % IV BOLUS
1000.0000 mL | Freq: Once | INTRAVENOUS | Status: AC
  Administered 2017-11-21: 1000 mL via INTRAVENOUS

## 2017-11-21 MED ORDER — MORPHINE SULFATE (PF) 2 MG/ML IV SOLN: 2.0000 mg | Freq: Once | INTRAVENOUS | Status: DC

## 2017-11-21 MED ORDER — LACTATED RINGERS IV BOLUS
200.0000 mL | Freq: Once | INTRAVENOUS | Status: DC
  Filled 2017-11-21: qty 250
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
## 2017-11-21 NOTE — ED Notes (Signed)
[facility] here for transport to [facility].  Pt alert.  Iv fluids infusing.

## 2017-11-21 NOTE — ED Triage Notes (Signed)
[place] beach [place] and grabbed tanning oil rather than sunscreen and used a whole bottle of aloe and is at a 9/10 pain and has blisters on Bilateral arms and  Legs.

## 2017-11-21 NOTE — ED Notes (Signed)
Applied dry dressing to bilateral arms and legs

## 2017-11-22 DIAGNOSIS — L559 Sunburn, unspecified: Secondary | ICD-10-CM | POA: Diagnosis not present

## 2017-11-22 DIAGNOSIS — Z8774 Personal history of (corrected) congenital malformations of heart and circulatory system: Secondary | ICD-10-CM | POA: Diagnosis not present

## 2017-11-22 DIAGNOSIS — L551 Sunburn of second degree: Secondary | ICD-10-CM | POA: Diagnosis not present

## 2017-11-22 DIAGNOSIS — F17210 Nicotine dependence, cigarettes, uncomplicated: Secondary | ICD-10-CM | POA: Diagnosis not present

## 2017-11-22 DIAGNOSIS — L550 Sunburn of first degree: Secondary | ICD-10-CM | POA: Diagnosis not present

## 2017-11-27 DIAGNOSIS — M5030 Other cervical disc degeneration, unspecified cervical region: Secondary | ICD-10-CM | POA: Insufficient documentation

## 2017-11-27 HISTORY — DX: Other cervical disc degeneration, unspecified cervical region: M50.30

## 2017-11-29 DIAGNOSIS — Z6827 Body mass index (BMI) 27.0-27.9, adult: Secondary | ICD-10-CM | POA: Diagnosis not present

## 2017-11-29 DIAGNOSIS — T24231A Burn of second degree of right lower leg, initial encounter: Secondary | ICD-10-CM | POA: Diagnosis not present

## 2017-11-29 DIAGNOSIS — T24232A Burn of second degree of left lower leg, initial encounter: Secondary | ICD-10-CM | POA: Insufficient documentation

## 2017-11-29 HISTORY — DX: Burn of second degree of left lower leg, initial encounter: T24.232A

## 2017-11-30 ENCOUNTER — Other Ambulatory Visit: Payer: Self-pay | Admitting: Physical Medicine and Rehabilitation

## 2017-11-30 DIAGNOSIS — M5030 Other cervical disc degeneration, unspecified cervical region: Secondary | ICD-10-CM

## 2017-12-09 ENCOUNTER — Other Ambulatory Visit: Payer: PRIVATE HEALTH INSURANCE

## 2017-12-16 ENCOUNTER — Other Ambulatory Visit: Payer: PRIVATE HEALTH INSURANCE

## 2018-03-05 DIAGNOSIS — J019 Acute sinusitis, unspecified: Secondary | ICD-10-CM | POA: Diagnosis not present

## 2018-03-05 DIAGNOSIS — Z6827 Body mass index (BMI) 27.0-27.9, adult: Secondary | ICD-10-CM | POA: Diagnosis not present

## 2018-03-05 DIAGNOSIS — J209 Acute bronchitis, unspecified: Secondary | ICD-10-CM | POA: Diagnosis not present

## 2018-03-09 DIAGNOSIS — E663 Overweight: Secondary | ICD-10-CM | POA: Diagnosis not present

## 2018-03-09 DIAGNOSIS — R030 Elevated blood-pressure reading, without diagnosis of hypertension: Secondary | ICD-10-CM | POA: Diagnosis not present

## 2018-03-09 DIAGNOSIS — R635 Abnormal weight gain: Secondary | ICD-10-CM | POA: Diagnosis not present

## 2018-04-02 DIAGNOSIS — R635 Abnormal weight gain: Secondary | ICD-10-CM | POA: Diagnosis not present

## 2018-04-02 DIAGNOSIS — R030 Elevated blood-pressure reading, without diagnosis of hypertension: Secondary | ICD-10-CM | POA: Diagnosis not present

## 2018-04-16 DIAGNOSIS — Z23 Encounter for immunization: Secondary | ICD-10-CM | POA: Diagnosis not present

## 2018-06-05 DIAGNOSIS — R635 Abnormal weight gain: Secondary | ICD-10-CM | POA: Diagnosis not present

## 2018-08-20 DIAGNOSIS — Z6828 Body mass index (BMI) 28.0-28.9, adult: Secondary | ICD-10-CM | POA: Diagnosis not present

## 2018-08-20 DIAGNOSIS — R635 Abnormal weight gain: Secondary | ICD-10-CM | POA: Diagnosis not present

## 2018-08-20 DIAGNOSIS — E663 Overweight: Secondary | ICD-10-CM | POA: Diagnosis not present

## 2018-09-09 HISTORY — PX: TONSILLECTOMY: SUR1361

## 2018-09-11 DIAGNOSIS — R196 Halitosis: Secondary | ICD-10-CM | POA: Diagnosis not present

## 2018-09-11 DIAGNOSIS — J039 Acute tonsillitis, unspecified: Secondary | ICD-10-CM | POA: Insufficient documentation

## 2018-09-11 DIAGNOSIS — J358 Other chronic diseases of tonsils and adenoids: Secondary | ICD-10-CM | POA: Diagnosis not present

## 2018-09-11 HISTORY — DX: Halitosis: R19.6

## 2018-09-11 HISTORY — DX: Acute tonsillitis, unspecified: J03.90

## 2018-09-18 DIAGNOSIS — J039 Acute tonsillitis, unspecified: Secondary | ICD-10-CM | POA: Diagnosis not present

## 2018-09-18 DIAGNOSIS — Z6827 Body mass index (BMI) 27.0-27.9, adult: Secondary | ICD-10-CM | POA: Diagnosis not present

## 2018-09-19 DIAGNOSIS — Q203 Discordant ventriculoarterial connection: Secondary | ICD-10-CM | POA: Diagnosis not present

## 2018-09-19 DIAGNOSIS — Z8774 Personal history of (corrected) congenital malformations of heart and circulatory system: Secondary | ICD-10-CM | POA: Diagnosis not present

## 2018-09-19 DIAGNOSIS — I471 Supraventricular tachycardia: Secondary | ICD-10-CM | POA: Diagnosis not present

## 2018-09-19 DIAGNOSIS — Q201 Double outlet right ventricle: Secondary | ICD-10-CM | POA: Diagnosis not present

## 2018-09-20 DIAGNOSIS — J353 Hypertrophy of tonsils with hypertrophy of adenoids: Secondary | ICD-10-CM | POA: Diagnosis not present

## 2018-09-20 DIAGNOSIS — J3503 Chronic tonsillitis and adenoiditis: Secondary | ICD-10-CM | POA: Diagnosis not present

## 2018-09-24 DIAGNOSIS — Z1151 Encounter for screening for human papillomavirus (HPV): Secondary | ICD-10-CM | POA: Diagnosis not present

## 2018-09-24 DIAGNOSIS — Z01419 Encounter for gynecological examination (general) (routine) without abnormal findings: Secondary | ICD-10-CM | POA: Diagnosis not present

## 2018-09-24 DIAGNOSIS — Z6828 Body mass index (BMI) 28.0-28.9, adult: Secondary | ICD-10-CM | POA: Diagnosis not present

## 2018-10-08 ENCOUNTER — Ambulatory Visit: Payer: BLUE CROSS/BLUE SHIELD | Admitting: Cardiology

## 2018-11-28 DIAGNOSIS — E663 Overweight: Secondary | ICD-10-CM | POA: Diagnosis not present

## 2018-11-28 DIAGNOSIS — R635 Abnormal weight gain: Secondary | ICD-10-CM | POA: Diagnosis not present

## 2018-11-28 DIAGNOSIS — Z3A29 29 weeks gestation of pregnancy: Secondary | ICD-10-CM | POA: Diagnosis not present

## 2018-12-28 DIAGNOSIS — R635 Abnormal weight gain: Secondary | ICD-10-CM | POA: Diagnosis not present

## 2018-12-28 DIAGNOSIS — Z6829 Body mass index (BMI) 29.0-29.9, adult: Secondary | ICD-10-CM | POA: Diagnosis not present

## 2018-12-28 DIAGNOSIS — E663 Overweight: Secondary | ICD-10-CM | POA: Diagnosis not present

## 2019-01-21 DIAGNOSIS — Z6829 Body mass index (BMI) 29.0-29.9, adult: Secondary | ICD-10-CM | POA: Diagnosis not present

## 2019-01-21 DIAGNOSIS — E663 Overweight: Secondary | ICD-10-CM | POA: Diagnosis not present

## 2019-01-21 DIAGNOSIS — R232 Flushing: Secondary | ICD-10-CM | POA: Diagnosis not present

## 2019-02-27 ENCOUNTER — Other Ambulatory Visit: Payer: Self-pay

## 2019-02-27 ENCOUNTER — Emergency Department: Payer: BC Managed Care – PPO

## 2019-02-27 ENCOUNTER — Encounter: Payer: Self-pay | Admitting: Emergency Medicine

## 2019-02-27 ENCOUNTER — Emergency Department
Admission: EM | Admit: 2019-02-27 | Discharge: 2019-02-27 | Disposition: A | Discharge status: Home / Self Care | Payer: BC Managed Care – PPO | Attending: Emergency Medicine | Admitting: Emergency Medicine

## 2019-02-27 DIAGNOSIS — N23 Unspecified renal colic: Secondary | ICD-10-CM | POA: Diagnosis not present

## 2019-02-27 DIAGNOSIS — F17210 Nicotine dependence, cigarettes, uncomplicated: Secondary | ICD-10-CM | POA: Diagnosis not present

## 2019-02-27 DIAGNOSIS — N200 Calculus of kidney: Secondary | ICD-10-CM

## 2019-02-27 DIAGNOSIS — R109 Unspecified abdominal pain: Secondary | ICD-10-CM | POA: Diagnosis present

## 2019-02-27 DIAGNOSIS — N132 Hydronephrosis with renal and ureteral calculous obstruction: Secondary | ICD-10-CM | POA: Diagnosis not present

## 2019-02-27 DIAGNOSIS — N202 Calculus of kidney with calculus of ureter: Secondary | ICD-10-CM | POA: Diagnosis not present

## 2019-02-27 DIAGNOSIS — Z79899 Other long term (current) drug therapy: Secondary | ICD-10-CM | POA: Insufficient documentation

## 2019-02-27 LAB — CBC WITH DIFFERENTIAL/PLATELET
Abs Immature Granulocytes: 0.04 10*3/uL (ref 0.00–0.07)
Basophils Absolute: 0.1 10*3/uL (ref 0.0–0.1)
Basophils Relative: 1 %
Eosinophils Absolute: 0.2 10*3/uL (ref 0.0–0.5)
Eosinophils Relative: 2 %
HCT: 43.8 % (ref 36.0–46.0)
Hemoglobin: 14.8 g/dL (ref 12.0–15.0)
Immature Granulocytes: 0 %
Lymphocytes Relative: 28 %
Lymphs Abs: 2.7 10*3/uL (ref 0.7–4.0)
MCH: 29 pg (ref 26.0–34.0)
MCHC: 33.8 g/dL (ref 30.0–36.0)
MCV: 85.9 fL (ref 80.0–100.0)
Monocytes Absolute: 0.8 10*3/uL (ref 0.1–1.0)
Monocytes Relative: 8 %
Neutro Abs: 5.9 10*3/uL (ref 1.7–7.7)
Neutrophils Relative %: 61 %
Platelets: 256 10*3/uL (ref 150–400)
RBC: 5.1 MIL/uL (ref 3.87–5.11)
RDW: 11.9 % (ref 11.5–15.5)
WBC: 9.6 10*3/uL (ref 4.0–10.5)
nRBC: 0 % (ref 0.0–0.2)

## 2019-02-27 LAB — BASIC METABOLIC PANEL
Anion gap: 5 (ref 5–15)
BUN: 12 mg/dL (ref 6–20)
CO2: 23 mmol/L (ref 22–32)
Calcium: 8.7 mg/dL — ABNORMAL LOW (ref 8.9–10.3)
Chloride: 109 mmol/L (ref 98–111)
Creatinine, Ser: 0.76 mg/dL (ref 0.44–1.00)
GFR calc Af Amer: >60 mL/min (ref >60)
GFR calc non Af Amer: >60 mL/min (ref >60)
Glucose, Bld: 91 mg/dL (ref 70–99)
Potassium: 3.3 mmol/L — ABNORMAL LOW (ref 3.5–5.1)
Sodium: 137 mmol/L (ref 135–145)

## 2019-02-27 LAB — URINALYSIS, COMPLETE (UACMP) WITH MICROSCOPIC
Bacteria, UA: NONE SEEN
Bilirubin Urine: NEGATIVE
Glucose, UA: NEGATIVE mg/dL
Ketones, ur: NEGATIVE mg/dL
Nitrite: NEGATIVE
Protein, ur: 30 mg/dL — AB
RBC / HPF: >50 RBC/hpf — ABNORMAL HIGH (ref 0–5)
Specific Gravity, Urine: 1.021 (ref 1.005–1.030)
pH: 5 (ref 5.0–8.0)

## 2019-02-27 LAB — POCT PREGNANCY, URINE: Preg Test, Ur: NEGATIVE

## 2019-02-27 MED ORDER — SODIUM CHLORIDE 0.9 % IV BOLUS
1000.0000 mL | Freq: Once | INTRAVENOUS | Status: AC
  Administered 2019-02-27: 1000 mL via INTRAVENOUS

## 2019-02-27 MED ORDER — KETOROLAC TROMETHAMINE 30 MG/ML IJ SOLN
15.0000 mg | Freq: Once | INTRAMUSCULAR | Status: AC
  Administered 2019-02-27: 15 mg via INTRAVENOUS
  Filled 2019-02-27: qty 1

## 2019-02-27 MED ORDER — TAMSULOSIN HCL 0.4 MG PO CAPS: 0.4000 mg | ORAL_CAPSULE | Freq: Every day | ORAL | 0 refills | Status: AC

## 2019-02-27 MED ORDER — ONDANSETRON HCL 4 MG/2ML IJ SOLN
4.0000 mg | Freq: Once | INTRAMUSCULAR | Status: AC
  Administered 2019-02-27: 4 mg via INTRAVENOUS
  Filled 2019-02-27: qty 2

## 2019-02-27 MED ORDER — ONDANSETRON 4 MG PO TBDP: 4.0000 mg | ORAL_TABLET | Freq: Three times a day (TID) | ORAL | 0 refills | Status: DC | PRN

## 2019-02-27 MED ORDER — OXYCODONE-ACETAMINOPHEN 5-325 MG PO TABS: 1.0000 | ORAL_TABLET | Freq: Four times a day (QID) | ORAL | 0 refills | Status: DC | PRN

## 2019-02-27 MED ORDER — HYDROMORPHONE HCL 1 MG/ML IJ SOLN
1.0000 mg | Freq: Once | INTRAMUSCULAR | Status: AC
  Administered 2019-02-27: 1 mg via INTRAVENOUS
  Filled 2019-02-27: qty 1
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
## 2019-02-27 NOTE — ED Triage Notes (Signed)
Patient ambulatory to triage with steady gait, without difficulty, tearful and appears uncomfortable; st awoke PTA with left flank pain and difficulty urinating; st hx kidney stone

## 2019-02-27 NOTE — ED Notes (Signed)
Patient to CT.

## 2019-02-27 NOTE — Discharge Instructions (Addendum)
Cut back on the soda!  Drink plenty of (other) fluids  Think about getting a water filter if on well water  You have been seen in the Emergency Department (ED) today for pain that we believe based on your workup, is caused by kidney stones.  As we have discussed, please drink plenty of fluids.  Please make a follow up appointment with the physician(s) listed elsewhere in this documentation.  You may take pain medication as needed but ONLY as prescribed.  Please also take your prescribed Flomax daily.  We also recommend that you take over-the-counter ibuprofen regularly according to label instructions over the next 5 days.  Take it with meals to minimize stomach discomfort.  Please see your doctor as soon as possible as stones may take 1-3 weeks to pass and you may require additional care or medications.  Do not drink alcohol, drive or participate in any other potentially dangerous activities while taking opiate pain medication as it may make you sleepy. Do not take this medication with any other sedating medications, either prescription or over-the-counter. If you were prescribed Percocet or Vicodin, do not take these with acetaminophen (Tylenol) as it is already contained within these medications.   This medication is an opiate (or narcotic) pain medication and can be habit forming.  Use it as little as possible to achieve adequate pain control.  Do not use or use it with extreme caution if you have a history of opiate abuse or dependence.  If you are on a pain contract with your primary care doctor or a pain specialist, be sure to let them know you were prescribed this medication today from the [facility] Emergency Department.  This medication is intended for your use only - do not give any to anyone else and keep it in a secure place where nobody else, especially children, have access to it.  It will also cause or worsen constipation, so you may want to consider taking an over-the-counter  stool softener while you are taking this medication.  Return to the Emergency Department (ED) or call your doctor if you have any worsening pain, fever, painful urination, are unable to urinate, or develop other symptoms that concern you.

## 2019-04-23 DIAGNOSIS — Z23 Encounter for immunization: Secondary | ICD-10-CM | POA: Diagnosis not present

## 2019-05-13 ENCOUNTER — Other Ambulatory Visit: Payer: Self-pay

## 2019-05-13 DIAGNOSIS — Z20822 Contact with and (suspected) exposure to covid-19: Secondary | ICD-10-CM

## 2019-05-15 ENCOUNTER — Ambulatory Visit (INDEPENDENT_AMBULATORY_CARE_PROVIDER_SITE_OTHER)
Admission: RE | Admit: 2019-05-15 | Discharge: 2019-05-15 | Disposition: A | Discharge status: Home / Self Care | Payer: BC Managed Care – PPO | Source: Ambulatory Visit

## 2019-05-15 DIAGNOSIS — J0100 Acute maxillary sinusitis, unspecified: Secondary | ICD-10-CM | POA: Diagnosis not present

## 2019-05-15 DIAGNOSIS — Z20828 Contact with and (suspected) exposure to other viral communicable diseases: Secondary | ICD-10-CM | POA: Diagnosis not present

## 2019-05-15 DIAGNOSIS — Z20822 Contact with and (suspected) exposure to covid-19: Secondary | ICD-10-CM

## 2019-05-15 LAB — NOVEL CORONAVIRUS, NAA: SARS-CoV-2, NAA: NOT DETECTED

## 2019-05-15 MED ORDER — BENZONATATE 100 MG PO CAPS: 100.0000 mg | ORAL_CAPSULE | Freq: Three times a day (TID) | ORAL | 0 refills | Status: DC

## 2019-05-15 MED ORDER — PREDNISONE 20 MG PO TABS: 20.0000 mg | ORAL_TABLET | Freq: Two times a day (BID) | ORAL | 0 refills | Status: AC

## 2019-05-15 MED ORDER — AMOXICILLIN-POT CLAVULANATE 875-125 MG PO TABS: 1.0000 | ORAL_TABLET | Freq: Two times a day (BID) | ORAL | 0 refills | Status: AC
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
## 2019-05-15 NOTE — Discharge Instructions (Addendum)
I am still concerned you may have COVID.  You may go through the drive-thru testing site to get retested if you like    In the meantime: You should remain isolated in your home for 10 days from symptom onset AND greater than 72 hours after symptoms resolution (absence of fever without the use of fever-reducing medication and improvement in respiratory symptoms), whichever is longer Get plenty of rest and push fluids Tessalon Perles prescribed for cough Augmentin prescribed for possible sinus infection Prednisone prescribed for sinus inflammation Use OTC medications like ibuprofen or tylenol as needed fever or pain Call or go to the ED if you have any new or worsening symptoms such as fever, worsening cough, shortness of breath, chest tightness, chest pain, turning blue, changes in mental status, etc..Marland Kitchen

## 2019-06-28 ENCOUNTER — Ambulatory Visit: Payer: BC Managed Care – PPO | Attending: Internal Medicine

## 2019-06-28 DIAGNOSIS — Z20822 Contact with and (suspected) exposure to covid-19: Secondary | ICD-10-CM

## 2019-06-28 DIAGNOSIS — Z20828 Contact with and (suspected) exposure to other viral communicable diseases: Secondary | ICD-10-CM | POA: Diagnosis not present

## 2019-06-30 LAB — NOVEL CORONAVIRUS, NAA: SARS-CoV-2, NAA: NOT DETECTED

## 2019-07-01 ENCOUNTER — Telehealth: Payer: BC Managed Care – PPO

## 2020-06-25 ENCOUNTER — Encounter: Payer: Self-pay | Admitting: *Deleted

## 2020-06-25 ENCOUNTER — Other Ambulatory Visit: Payer: Self-pay

## 2020-06-25 ENCOUNTER — Emergency Department
Admission: EM | Admit: 2020-06-25 | Discharge: 2020-06-25 | Disposition: A | Discharge status: Home / Self Care | Payer: BC Managed Care – PPO | Attending: Student in an Organized Health Care Education/Training Program | Admitting: Student in an Organized Health Care Education/Training Program

## 2020-06-25 DIAGNOSIS — R110 Nausea: Secondary | ICD-10-CM | POA: Insufficient documentation

## 2020-06-25 DIAGNOSIS — R109 Unspecified abdominal pain: Secondary | ICD-10-CM | POA: Insufficient documentation

## 2020-06-25 DIAGNOSIS — Z5321 Procedure and treatment not carried out due to patient leaving prior to being seen by health care provider: Secondary | ICD-10-CM | POA: Insufficient documentation

## 2020-06-25 DIAGNOSIS — M25551 Pain in right hip: Secondary | ICD-10-CM | POA: Insufficient documentation

## 2020-06-25 LAB — CBC
HCT: 47.4 % — ABNORMAL HIGH (ref 36.0–46.0)
Hemoglobin: 16 g/dL — ABNORMAL HIGH (ref 12.0–15.0)
MCH: 29.6 pg (ref 26.0–34.0)
MCHC: 33.8 g/dL (ref 30.0–36.0)
MCV: 87.8 fL (ref 80.0–100.0)
Platelets: 237 10*3/uL (ref 150–400)
RBC: 5.4 MIL/uL — ABNORMAL HIGH (ref 3.87–5.11)
RDW: 11.8 % (ref 11.5–15.5)
WBC: 7 10*3/uL (ref 4.0–10.5)
nRBC: 0 % (ref 0.0–0.2)

## 2020-06-25 LAB — BASIC METABOLIC PANEL
Anion gap: 10 (ref 5–15)
BUN: 9 mg/dL (ref 6–20)
CO2: 26 mmol/L (ref 22–32)
Calcium: 9.2 mg/dL (ref 8.9–10.3)
Chloride: 104 mmol/L (ref 98–111)
Creatinine, Ser: 0.88 mg/dL (ref 0.44–1.00)
GFR, Estimated: >60 mL/min (ref >60)
Glucose, Bld: 95 mg/dL (ref 70–99)
Potassium: 4 mmol/L (ref 3.5–5.1)
Sodium: 140 mmol/L (ref 135–145)

## 2020-06-25 LAB — URINALYSIS, COMPLETE (UACMP) WITH MICROSCOPIC
Bacteria, UA: NONE SEEN
Bilirubin Urine: NEGATIVE
Glucose, UA: NEGATIVE mg/dL
Ketones, ur: NEGATIVE mg/dL
Leukocytes,Ua: NEGATIVE
Nitrite: NEGATIVE
Protein, ur: NEGATIVE mg/dL
Specific Gravity, Urine: 1.013 (ref 1.005–1.030)
pH: 6 (ref 5.0–8.0)

## 2020-06-25 LAB — POC URINE PREG, ED: Preg Test, Ur: NEGATIVE

## 2020-06-25 MED ORDER — OXYCODONE-ACETAMINOPHEN 5-325 MG PO TABS
1.0000 | ORAL_TABLET | ORAL | Status: DC | PRN
  Administered 2020-06-25: 1 via ORAL
  Filled 2020-06-25: qty 1

## 2020-06-25 NOTE — ED Triage Notes (Signed)
Right flank pain with pain radiating down into right hip and difficulty voiding.  Pt reports that urine was dark and that she has hx of kidney stones.  Pain severe and causing nausea.

## 2020-12-18 DIAGNOSIS — Z87440 Personal history of urinary (tract) infections: Secondary | ICD-10-CM | POA: Insufficient documentation

## 2020-12-18 DIAGNOSIS — H0015 Chalazion left lower eyelid: Secondary | ICD-10-CM

## 2020-12-18 HISTORY — DX: Chalazion left lower eyelid: H00.15

## 2020-12-18 HISTORY — DX: Personal history of urinary (tract) infections: Z87.440

## 2021-01-01 ENCOUNTER — Ambulatory Visit: Payer: No Typology Code available for payment source | Admitting: Internal Medicine

## 2021-01-01 DIAGNOSIS — Z0289 Encounter for other administrative examinations: Secondary | ICD-10-CM

## 2021-01-27 ENCOUNTER — Other Ambulatory Visit (HOSPITAL_COMMUNITY): Payer: Self-pay

## 2021-01-27 ENCOUNTER — Other Ambulatory Visit: Payer: Self-pay | Admitting: Physician Assistant

## 2021-01-27 DIAGNOSIS — A09 Infectious gastroenteritis and colitis, unspecified: Secondary | ICD-10-CM | POA: Insufficient documentation

## 2021-01-27 HISTORY — DX: Infectious gastroenteritis and colitis, unspecified: A09

## 2021-01-27 MED ORDER — ONDANSETRON 4 MG PO TBDP
4.0000 mg | ORAL_TABLET | Freq: Three times a day (TID) | ORAL | 0 refills | Status: DC
  Filled 2021-01-27: qty 20, 7d supply, fill #0

## 2021-02-19 ENCOUNTER — Encounter: Payer: Self-pay | Admitting: Physician Assistant

## 2021-02-19 ENCOUNTER — Ambulatory Visit (INDEPENDENT_AMBULATORY_CARE_PROVIDER_SITE_OTHER): Payer: No Typology Code available for payment source | Admitting: Physician Assistant

## 2021-02-19 ENCOUNTER — Other Ambulatory Visit: Payer: Self-pay

## 2021-02-19 ENCOUNTER — Other Ambulatory Visit (HOSPITAL_COMMUNITY): Payer: Self-pay

## 2021-02-19 VITALS — BP 138/98 | HR 98 | Temp 97.7°F | Ht 71.0 in | Wt 195.2 lb

## 2021-02-19 DIAGNOSIS — Z808 Family history of malignant neoplasm of other organs or systems: Secondary | ICD-10-CM

## 2021-02-19 DIAGNOSIS — R221 Localized swelling, mass and lump, neck: Secondary | ICD-10-CM | POA: Diagnosis not present

## 2021-02-19 DIAGNOSIS — D751 Secondary polycythemia: Secondary | ICD-10-CM

## 2021-02-19 DIAGNOSIS — M5412 Radiculopathy, cervical region: Secondary | ICD-10-CM | POA: Diagnosis not present

## 2021-02-19 LAB — CBC WITH DIFFERENTIAL/PLATELET
Basophils Absolute: 0 10*3/uL (ref 0.0–0.1)
Basophils Relative: 0.6 % (ref 0.0–3.0)
Eosinophils Absolute: 0.1 10*3/uL (ref 0.0–0.7)
Eosinophils Relative: 1.4 % (ref 0.0–5.0)
HCT: 46.9 % — ABNORMAL HIGH (ref 36.0–46.0)
Hemoglobin: 15.7 g/dL — ABNORMAL HIGH (ref 12.0–15.0)
Lymphocytes Relative: 21.1 % (ref 12.0–46.0)
Lymphs Abs: 1.4 10*3/uL (ref 0.7–4.0)
MCHC: 33.4 g/dL (ref 30.0–36.0)
MCV: 87 fl (ref 78.0–100.0)
Monocytes Absolute: 0.5 10*3/uL (ref 0.1–1.0)
Monocytes Relative: 6.6 % (ref 3.0–12.0)
Neutro Abs: 4.8 10*3/uL (ref 1.4–7.7)
Neutrophils Relative %: 70.3 % (ref 43.0–77.0)
Platelets: 246 10*3/uL (ref 150.0–400.0)
RBC: 5.38 Mil/uL — ABNORMAL HIGH (ref 3.87–5.11)
RDW: 12.8 % (ref 11.5–15.5)
WBC: 6.8 10*3/uL (ref 4.0–10.5)

## 2021-02-19 MED ORDER — KETOROLAC TROMETHAMINE 60 MG/2ML IM SOLN
60.0000 mg | Freq: Once | INTRAMUSCULAR | Status: AC
  Administered 2021-02-19: 60 mg via INTRAMUSCULAR

## 2021-02-19 MED ORDER — METHYLPREDNISOLONE 4 MG PO TBPK
ORAL_TABLET | ORAL | 0 refills | Status: DC
  Filled 2021-02-19: qty 21, 6d supply, fill #0
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
## 2021-02-19 NOTE — Patient Instructions (Addendum)
Good to see you today!  Please go to the lab for blood work and I will send results through [place]. Someone will call to schedule US thyroid.  Toradol 60 mg IM in office today. Take medrol dosepak starting tomorrow. Call and let me know when you are ready to schedule MRI of your neck.

## 2021-02-20 LAB — THYROID PANEL WITH TSH
Free Thyroxine Index: 2.3 (ref 1.4–3.8)
T3 Uptake: 33 % (ref 22–35)
T4, Total: 6.9 ug/dL (ref 5.1–11.9)
TSH: 0.77 mIU/L

## 2021-02-26 ENCOUNTER — Ambulatory Visit
Admission: RE | Admit: 2021-02-26 | Discharge: 2021-02-26 | Disposition: A | Discharge status: Home / Self Care | Payer: No Typology Code available for payment source | Source: Ambulatory Visit | Attending: Physician Assistant | Admitting: Physician Assistant

## 2021-02-26 DIAGNOSIS — Z808 Family history of malignant neoplasm of other organs or systems: Secondary | ICD-10-CM

## 2021-02-26 DIAGNOSIS — R221 Localized swelling, mass and lump, neck: Secondary | ICD-10-CM

## 2021-02-28 ENCOUNTER — Encounter: Payer: Self-pay | Admitting: Physician Assistant

## 2021-03-01 ENCOUNTER — Other Ambulatory Visit (HOSPITAL_COMMUNITY): Payer: Self-pay

## 2021-03-02 ENCOUNTER — Other Ambulatory Visit: Payer: Self-pay | Admitting: Physician Assistant

## 2021-03-02 ENCOUNTER — Other Ambulatory Visit: Payer: Self-pay

## 2021-03-02 DIAGNOSIS — M5412 Radiculopathy, cervical region: Secondary | ICD-10-CM

## 2021-03-12 ENCOUNTER — Other Ambulatory Visit (HOSPITAL_COMMUNITY): Payer: Self-pay

## 2021-03-12 ENCOUNTER — Other Ambulatory Visit: Payer: Self-pay | Admitting: Physician Assistant

## 2021-03-12 ENCOUNTER — Encounter: Payer: Self-pay | Admitting: Physician Assistant

## 2021-03-12 MED ORDER — PREDNISONE 50 MG PO TABS
50.0000 mg | ORAL_TABLET | Freq: Every day | ORAL | 0 refills | Status: DC
  Filled 2021-03-12: qty 5, 5d supply, fill #0

## 2021-03-19 ENCOUNTER — Ambulatory Visit
Admission: RE | Admit: 2021-03-19 | Discharge: 2021-03-19 | Disposition: A | Discharge status: Home / Self Care | Payer: No Typology Code available for payment source | Source: Ambulatory Visit | Attending: Physician Assistant | Admitting: Physician Assistant

## 2021-03-19 DIAGNOSIS — M5412 Radiculopathy, cervical region: Secondary | ICD-10-CM

## 2021-03-19 MED ORDER — GADOBENATE DIMEGLUMINE 529 MG/ML IV SOLN
17.0000 mL | Freq: Once | INTRAVENOUS | Status: AC | PRN
  Administered 2021-03-19: 17 mL via INTRAVENOUS

## 2021-03-19 MED ORDER — GADOBENATE DIMEGLUMINE 529 MG/ML IV SOLN: 17.0000 mL | Freq: Once | INTRAVENOUS | Status: DC | PRN

## 2021-03-21 ENCOUNTER — Encounter: Payer: Self-pay | Admitting: Physician Assistant

## 2021-03-22 ENCOUNTER — Telehealth (INDEPENDENT_AMBULATORY_CARE_PROVIDER_SITE_OTHER): Payer: No Typology Code available for payment source | Admitting: Physician Assistant

## 2021-03-22 ENCOUNTER — Other Ambulatory Visit (HOSPITAL_COMMUNITY): Payer: Self-pay

## 2021-03-22 ENCOUNTER — Encounter: Payer: Self-pay | Admitting: Physician Assistant

## 2021-03-22 VITALS — Ht 71.0 in

## 2021-03-22 DIAGNOSIS — M5412 Radiculopathy, cervical region: Secondary | ICD-10-CM

## 2021-03-22 MED ORDER — HYDROCODONE-ACETAMINOPHEN 10-325 MG PO TABS
1.0000 | ORAL_TABLET | Freq: Four times a day (QID) | ORAL | 0 refills | Status: DC | PRN
  Filled 2021-03-22: qty 20, 5d supply, fill #0

## 2021-03-22 NOTE — Telephone Encounter (Signed)
Discussed at appt 03/22/2021

## 2021-03-22 NOTE — Progress Notes (Signed)
Virtual Visit via Video Note  I connected with  Vanessa Thomas  on 03/22/21 at 11:00 AM EDT by a video enabled telemedicine application and verified that I am speaking with the correct person using two identifiers.  Location: Patient: Work (ACLS course) Provider: Therapist, music at [place] present: Patient and myself   I discussed the limitations of evaluation and management by telemedicine and the availability of in person appointments. The patient expressed understanding and agreed to proceed.   History of Present Illness:  Pleasant 40 year old patient presents for video visit today for ongoing neck pain and radiculopathy into both arms, right worse than left.  States that she has noticed this for the last few years but it is definitely worsened.  She did have an MRI done and an MRA done on 03/19/2021.  The MRA was normal.  The MRI of C-spine showed several levels of disc protrusions and narrowing. Dr. Emelda Brothers is Neurosurgeon and she has an appointment scheduled this Friday 03/26/21.  She states that the pain is so bad that she cannot sleep at night and it has been especially worse in the last week.  She does have tingling into her right ring and pinky fingers.  She does feel weaker on her right hand grip versus her left hand grip.  Denies any headaches, facial changes, slurred speech, other symptoms.  She did try a prednisone course of 50 mg over 5 days and did not experience any relief with this.  She states she just wants to be able to make it to this Friday and to be able to get some sleep in the meantime as it is severely affecting her work.   Observations/Objective:   Gen: Awake, alert, no acute distress Resp: Breathing is even and non-labored Psych: calm/pleasant demeanor Neuro: Alert and Oriented x 3, + facial symmetry, speech is clear. MSK: She has good range of motion turning her neck to the right however she is very limited turning to the left.   Flexion and extension looks okay.   Assessment and Plan: 1. Cervical radiculopathy I personally reviewed the patient's MRI and MRA reports with her.  She does have an appointment with neurosurgery in 4 days.  PDMP reviewed and no red flags found.  Agreed to start her on Norco 10-325 mg 4 times a day as needed for pain.  Cautioned with driving and if she were to operate any heavy machinery.  Patient is aware of risk versus benefits.  She is understanding that this is a short-term course and she will discuss further plan of management of her symptoms with neurosurgeon this coming Friday.   Follow Up Instructions:    I discussed the assessment and treatment plan with the patient. The patient was provided an opportunity to ask questions and all were answered. The patient agreed with the plan and demonstrated an understanding of the instructions.   The patient was advised to call back or seek an in-person evaluation if the symptoms worsen or if the condition fails to improve as anticipated.  Ishmeal Rorie M Deontaye Civello, PA-C

## 2021-03-25 ENCOUNTER — Other Ambulatory Visit: Payer: Self-pay | Admitting: Physician Assistant

## 2021-03-25 ENCOUNTER — Encounter: Payer: Self-pay | Admitting: Physician Assistant

## 2021-03-25 MED ORDER — HYDROCODONE-ACETAMINOPHEN 10-325 MG PO TABS
1.0000 | ORAL_TABLET | Freq: Four times a day (QID) | ORAL | 0 refills | Status: AC | PRN
  Filled 2021-03-26: qty 28, 7d supply, fill #0

## 2021-03-26 ENCOUNTER — Other Ambulatory Visit (HOSPITAL_COMMUNITY): Payer: Self-pay

## 2021-04-02 ENCOUNTER — Other Ambulatory Visit (HOSPITAL_COMMUNITY): Payer: Self-pay

## 2021-04-02 MED ORDER — HYDROCODONE-ACETAMINOPHEN 10-325 MG PO TABS
1.0000 | ORAL_TABLET | ORAL | 0 refills | Status: DC | PRN
  Filled 2021-04-02: qty 30, 5d supply, fill #0

## 2021-04-02 MED ORDER — METHYLPREDNISOLONE 4 MG PO TBPK
ORAL_TABLET | ORAL | 0 refills | Status: DC
  Filled 2021-04-02: qty 21, 6d supply, fill #0

## 2021-04-09 ENCOUNTER — Other Ambulatory Visit (HOSPITAL_COMMUNITY): Payer: Self-pay

## 2021-04-09 MED ORDER — HYDROCODONE-ACETAMINOPHEN 10-325 MG PO TABS
1.0000 | ORAL_TABLET | Freq: Four times a day (QID) | ORAL | 0 refills | Status: DC | PRN
  Filled 2021-04-09: qty 30, 8d supply, fill #0

## 2021-04-09 MED ORDER — GABAPENTIN 100 MG PO CAPS
100.0000 mg | ORAL_CAPSULE | Freq: Every day | ORAL | 1 refills | Status: DC
  Filled 2021-04-09: qty 90, 30d supply, fill #0

## 2021-04-09 MED ORDER — PREDNISONE 10 MG (48) PO TBPK
ORAL_TABLET | ORAL | 0 refills | Status: DC
  Filled 2021-04-09: qty 48, 12d supply, fill #0

## 2021-04-13 ENCOUNTER — Other Ambulatory Visit (HOSPITAL_COMMUNITY): Payer: Self-pay

## 2021-04-13 MED ORDER — HYDROCODONE-ACETAMINOPHEN 10-325 MG PO TABS
1.0000 | ORAL_TABLET | Freq: Four times a day (QID) | ORAL | 0 refills | Status: DC | PRN
  Filled 2021-04-13 – 2021-04-14 (×2): qty 30, 8d supply, fill #0

## 2021-04-14 ENCOUNTER — Other Ambulatory Visit (HOSPITAL_COMMUNITY): Payer: Self-pay

## 2021-04-15 ENCOUNTER — Other Ambulatory Visit (HOSPITAL_COMMUNITY): Payer: Self-pay

## 2021-04-20 DIAGNOSIS — M5412 Radiculopathy, cervical region: Secondary | ICD-10-CM | POA: Insufficient documentation

## 2021-04-22 ENCOUNTER — Other Ambulatory Visit (HOSPITAL_COMMUNITY): Payer: Self-pay

## 2021-04-22 MED ORDER — HYDROCODONE-ACETAMINOPHEN 10-325 MG PO TABS
1.0000 | ORAL_TABLET | Freq: Four times a day (QID) | ORAL | 0 refills | Status: DC | PRN
  Filled 2021-04-22: qty 30, 8d supply, fill #0

## 2021-04-28 ENCOUNTER — Other Ambulatory Visit (HOSPITAL_COMMUNITY): Payer: Self-pay

## 2021-04-28 MED ORDER — GABAPENTIN 100 MG PO CAPS
100.0000 mg | ORAL_CAPSULE | Freq: Every evening | ORAL | 1 refills | Status: DC
  Filled 2021-04-28: qty 90, 30d supply, fill #0

## 2021-04-28 MED ORDER — HYDROCODONE-ACETAMINOPHEN 10-325 MG PO TABS
1.0000 | ORAL_TABLET | Freq: Four times a day (QID) | ORAL | 0 refills | Status: DC | PRN
  Filled 2021-04-28: qty 30, 8d supply, fill #0

## 2021-05-03 ENCOUNTER — Other Ambulatory Visit (HOSPITAL_COMMUNITY): Payer: Self-pay

## 2021-05-03 MED ORDER — HYDROCODONE-ACETAMINOPHEN 10-325 MG PO TABS
1.0000 | ORAL_TABLET | Freq: Four times a day (QID) | ORAL | 0 refills | Status: DC | PRN
  Filled 2021-05-03: qty 30, 8d supply, fill #0

## 2021-05-04 ENCOUNTER — Other Ambulatory Visit (HOSPITAL_COMMUNITY): Payer: Self-pay

## 2021-05-10 ENCOUNTER — Other Ambulatory Visit (HOSPITAL_COMMUNITY): Payer: Self-pay

## 2021-05-10 MED ORDER — HYDROCODONE-ACETAMINOPHEN 10-325 MG PO TABS
1.0000 | ORAL_TABLET | Freq: Four times a day (QID) | ORAL | 0 refills | Status: DC | PRN
  Filled 2021-05-10 (×3): qty 30, 8d supply, fill #0
  Filled 2021-05-11: qty 27, 7d supply, fill #0
  Filled 2021-05-11: qty 3, 1d supply, fill #0

## 2021-05-11 ENCOUNTER — Other Ambulatory Visit (HOSPITAL_COMMUNITY): Payer: Self-pay

## 2021-05-17 ENCOUNTER — Other Ambulatory Visit (HOSPITAL_COMMUNITY): Payer: Self-pay

## 2021-05-17 MED ORDER — HYDROCODONE-ACETAMINOPHEN 10-325 MG PO TABS
1.0000 | ORAL_TABLET | Freq: Four times a day (QID) | ORAL | 0 refills | Status: DC | PRN
  Filled 2021-05-17 – 2021-05-18 (×2): qty 30, 8d supply, fill #0

## 2021-05-18 ENCOUNTER — Other Ambulatory Visit (HOSPITAL_COMMUNITY): Payer: Self-pay

## 2021-05-24 ENCOUNTER — Other Ambulatory Visit (HOSPITAL_COMMUNITY): Payer: Self-pay

## 2021-05-24 MED ORDER — HYDROCODONE-ACETAMINOPHEN 10-325 MG PO TABS
1.0000 | ORAL_TABLET | Freq: Four times a day (QID) | ORAL | 0 refills | Status: DC | PRN
  Filled 2021-05-24: qty 30, 8d supply, fill #0

## 2021-05-25 ENCOUNTER — Other Ambulatory Visit (HOSPITAL_COMMUNITY): Payer: Self-pay

## 2021-05-31 ENCOUNTER — Other Ambulatory Visit (HOSPITAL_COMMUNITY): Payer: Self-pay

## 2021-05-31 MED ORDER — HYDROCODONE-ACETAMINOPHEN 10-325 MG PO TABS
1.0000 | ORAL_TABLET | Freq: Four times a day (QID) | ORAL | 0 refills | Status: DC | PRN
  Filled 2021-05-31 – 2021-06-01 (×2): qty 30, 8d supply, fill #0

## 2021-06-01 ENCOUNTER — Other Ambulatory Visit (HOSPITAL_COMMUNITY): Payer: Self-pay

## 2021-06-02 ENCOUNTER — Other Ambulatory Visit (HOSPITAL_COMMUNITY): Payer: Self-pay

## 2021-06-09 ENCOUNTER — Other Ambulatory Visit (HOSPITAL_COMMUNITY): Payer: Self-pay

## 2021-06-09 MED ORDER — HYDROCODONE-ACETAMINOPHEN 10-325 MG PO TABS
1.0000 | ORAL_TABLET | Freq: Four times a day (QID) | ORAL | 0 refills | Status: DC | PRN
  Filled 2021-06-09: qty 30, 8d supply, fill #0

## 2021-06-10 ENCOUNTER — Other Ambulatory Visit (HOSPITAL_COMMUNITY): Payer: Self-pay

## 2021-06-14 ENCOUNTER — Telehealth: Payer: No Typology Code available for payment source | Admitting: Physician Assistant

## 2021-06-14 ENCOUNTER — Other Ambulatory Visit (HOSPITAL_COMMUNITY): Payer: Self-pay

## 2021-06-14 DIAGNOSIS — J111 Influenza due to unidentified influenza virus with other respiratory manifestations: Secondary | ICD-10-CM

## 2021-06-14 MED ORDER — OSELTAMIVIR PHOSPHATE 75 MG PO CAPS
75.0000 mg | ORAL_CAPSULE | Freq: Two times a day (BID) | ORAL | 0 refills | Status: DC
Start: 2021-06-14 — End: 2022-08-03
  Filled 2021-06-14: qty 10, 5d supply, fill #0

## 2021-06-14 NOTE — Patient Instructions (Signed)
Vanessa Thomas, thank you for joining Mar Daring, PA-C for today's virtual visit.  While this provider is not your primary care provider (PCP), if your PCP is located in our provider database this encounter information will be shared with them immediately following your visit.  Consent: (Patient) Vanessa Thomas provided verbal consent for this virtual visit at the beginning of the encounter.  Current Medications:  Current Outpatient Medications:    oseltamivir (TAMIFLU) 75 MG capsule, Take 1 capsule (75 mg total) by mouth 2 (two) times daily., Disp: 10 capsule, Rfl: 0   gabapentin (NEURONTIN) 100 MG capsule, Take 1-3 capsules (100-300 mg total) by mouth at bedtime., Disp: 90 capsule, Rfl: 1   gabapentin (NEURONTIN) 100 MG capsule, Take 1-3 capsules (100-300 mg total) by mouth at bedtime., Disp: 90 capsule, Rfl: 1   HYDROcodone-acetaminophen (NORCO) 10-325 MG tablet, TAKE 1 TABLET BY MOUTH EVERY 6 HOURS AS NEEDED, Disp: 30 tablet, Rfl: 0   ibuprofen (ADVIL,MOTRIN) 200 MG tablet, Take 2 tablets by mouth as needed., Disp: , Rfl:    methylPREDNISolone (MEDROL DOSEPAK) 4 MG TBPK tablet, Follow enclosed patient instructions, Disp: 21 tablet, Rfl: 0   metoprolol succinate (TOPROL-XL) 100 MG 24 hr tablet, Take 1 tablet (100 mg total) by mouth 2 (two) times daily. Take with or immediately following a meal., Disp: 180 tablet, Rfl: 3   ondansetron (ZOFRAN-ODT) 4 MG disintegrating tablet, Take 1 tablet (4 mg total) by mouth every 8 (eight) hours until directed to stop, take only as needed, Disp: 20 tablet, Rfl: 0   predniSONE (STERAPRED UNI-PAK 48 TAB) 10 MG (48) TBPK tablet, Take as directed on package, Disp: 48 each, Rfl: 0   Medications ordered in this encounter:  Meds ordered this encounter  Medications   oseltamivir (TAMIFLU) 75 MG capsule    Sig: Take 1 capsule (75 mg total) by mouth 2 (two) times daily.    Dispense:  10 capsule    Refill:  0    Order Specific Question:   Supervising  Provider    Answer:   Sabra Heck, Pose[place]     *If you need refills on other medications prior to your next appointment, please contact your pharmacy*  Follow-Up: Call back or seek an in-person evaluation if the symptoms worsen or if the condition fails to improve as anticipated.  Other Instructions Influenza, Adult Influenza is also called "the flu." It is an infection in the lungs, nose, and throat (respiratory tract). It spreads easily from person to person (is contagious). The flu causes symptoms that are like a cold, along with high fever and body aches. What are the causes? This condition is caused by the influenza virus. You can get the virus by: Breathing in droplets that are in the air after a person infected with the flu coughed or sneezed. Touching something that has the virus on it and then touching your mouth, nose, or eyes. What increases the risk? Certain things may make you more likely to get the flu. These include: Not washing your hands often. Having close contact with many people during cold and flu season. Touching your mouth, eyes, or nose without first washing your hands. Not getting a flu shot every year. You may have a higher risk for the flu, and serious problems, such as a lung infection (pneumonia), if you: Are older than 65. Are pregnant. Have a weakened disease-fighting system (immune system) because of a disease or because you are taking certain medicines. Have a long-term (chronic)  condition, such as: Heart, kidney, or lung disease. Diabetes. Asthma. Have a liver disorder. Are very overweight (morbidly obese). Have anemia. What are the signs or symptoms? Symptoms usually begin suddenly and last 4-14 days. They may include: Fever and chills. Headaches, body aches, or muscle aches. Sore throat. Cough. Runny or stuffy (congested) nose. Feeling discomfort in your chest. Not wanting to eat as much as normal. Feeling weak or tired. Feeling  dizzy. Feeling sick to your stomach or throwing up. How is this treated? If the flu is found early, you can be treated with antiviral medicine. This can help to reduce how bad the illness is and how long it lasts. This may be given by mouth or through an IV tube. Taking care of yourself at home can help your symptoms get better. Your doctor may want you to: Take over-the-counter medicines. Drink plenty of fluids. The flu often goes away on its own. If you have very bad symptoms or other problems, you may be treated in a hospital. Follow these instructions at home:   Activity Rest as needed. Get plenty of sleep. Stay home from work or school as told by your doctor. Do not leave home until you do not have a fever for 24 hours without taking medicine. Leave home only to go to your doctor. Eating and drinking Take an ORS (oral rehydration solution). This is a drink that is sold at pharmacies and stores. Drink enough fluid to keep your pee pale yellow. Drink clear fluids in small amounts as you are able. Clear fluids include: Water. Ice chips. Fruit juice mixed with water. Low-calorie sports drinks. Eat bland foods that are easy to digest. Eat small amounts as you are able. These foods include: Bananas. Applesauce. Rice. Lean meats. Toast. Crackers. Do not eat or drink: Fluids that have a lot of sugar or caffeine. Alcohol. Spicy or fatty foods. General instructions Take over-the-counter and prescription medicines only as told by your doctor. Use a cool mist humidifier to add moisture to the air in your home. This can make it easier for you to breathe. When using a cool mist humidifier, clean it daily. Empty water and replace with clean water. Cover your mouth and nose when you cough or sneeze. Wash your hands with soap and water often and for at least 20 seconds. This is also important after you cough or sneeze. If you cannot use soap and water, use alcohol-based hand  sanitizer. Keep all follow-up visits. How is this prevented?  Get a flu shot every year. You may get the flu shot in late summer, fall, or winter. Ask your doctor when you should get your flu shot. Avoid contact with people who are sick during fall and winter. This is cold and flu season. Contact a doctor if: You get new symptoms. You have: Chest pain. Watery poop (diarrhea). A fever. Your cough gets worse. You start to have more mucus. You feel sick to your stomach. You throw up. Get help right away if you: Have shortness of breath. Have trouble breathing. Have skin or nails that turn a bluish color. Have very bad pain or stiffness in your neck. Get a sudden headache. Get sudden pain in your face or ear. Cannot eat or drink without throwing up. These symptoms may represent a serious problem that is an emergency. Get medical help right away. Call your local emergency services (911 in the [place] Do not wait to see if the symptoms will go away. Do not drive yourself  to the hospital. Summary Influenza is also called "the flu." It is an infection in the lungs, nose, and throat. It spreads easily from person to person. Take over-the-counter and prescription medicines only as told by your doctor. Getting a flu shot every year is the best way to not get the flu. This information is not intended to replace advice given to you by your health care provider. Make sure you discuss any questions you have with your health care provider. Document Revised: 02/14/2020 Document Reviewed: 02/14/2020 Elsevier Patient Education  2022 [employer].    If you have been instructed to have an in-person evaluation today at a local Urgent Care facility, please use the link below. It will take you to a list of all of our available [facility], including address, phone number and hours of operation. Please do not delay care.  [facility]  If you or a family member do not have a  primary care provider, use the link below to schedule a visit and establish care. When you choose a [facility] primary care physician or advanced practice provider, you gain a long-term partner in health. Find a Primary Care Provider  Learn more about [facility]'s in-office and virtual care options: [facility]
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
## 2021-06-16 ENCOUNTER — Other Ambulatory Visit (HOSPITAL_COMMUNITY): Payer: Self-pay

## 2021-06-16 MED ORDER — HYDROCODONE-ACETAMINOPHEN 10-325 MG PO TABS
1.0000 | ORAL_TABLET | ORAL | 0 refills | Status: DC
  Filled 2021-06-16: qty 30, 5d supply, fill #0

## 2021-06-21 ENCOUNTER — Other Ambulatory Visit (HOSPITAL_COMMUNITY): Payer: Self-pay

## 2021-06-21 MED ORDER — HYDROCODONE-ACETAMINOPHEN 10-325 MG PO TABS
1.0000 | ORAL_TABLET | ORAL | 0 refills | Status: DC
  Filled 2021-06-21: qty 30, 5d supply, fill #0

## 2021-06-28 ENCOUNTER — Other Ambulatory Visit (HOSPITAL_COMMUNITY): Payer: Self-pay

## 2021-06-28 MED ORDER — HYDROCODONE-ACETAMINOPHEN 10-325 MG PO TABS
1.0000 | ORAL_TABLET | ORAL | 0 refills | Status: DC
  Filled 2021-06-28: qty 30, 5d supply, fill #0

## 2021-07-05 ENCOUNTER — Other Ambulatory Visit (HOSPITAL_COMMUNITY): Payer: Self-pay

## 2021-07-06 ENCOUNTER — Other Ambulatory Visit (HOSPITAL_COMMUNITY): Payer: Self-pay

## 2021-07-06 MED ORDER — HYDROCODONE-ACETAMINOPHEN 10-325 MG PO TABS
1.0000 | ORAL_TABLET | ORAL | 0 refills | Status: DC
  Filled 2021-07-06: qty 30, 5d supply, fill #0

## 2021-07-07 ENCOUNTER — Other Ambulatory Visit (HOSPITAL_COMMUNITY): Payer: Self-pay

## 2021-07-09 ENCOUNTER — Other Ambulatory Visit (HOSPITAL_COMMUNITY): Payer: Self-pay

## 2021-07-09 ENCOUNTER — Telehealth: Payer: No Typology Code available for payment source | Admitting: Physician Assistant

## 2021-07-09 DIAGNOSIS — N750 Cyst of Bartholin's gland: Secondary | ICD-10-CM | POA: Diagnosis not present

## 2021-07-09 MED ORDER — CEPHALEXIN 500 MG PO CAPS
500.0000 mg | ORAL_CAPSULE | Freq: Three times a day (TID) | ORAL | 0 refills | Status: DC
  Filled 2021-07-09: qty 15, 5d supply, fill #0

## 2021-07-09 NOTE — Progress Notes (Signed)
E-Visit for Vaginal Symptoms  We are sorry that you are not feeling well. Here is how we plan to help! Based on what you shared with me it looks like you: Bartholin Cyst.  Bartholin's cyst If the tube (duct) that drains the fluid becomes blocked then a fluid-filled swelling (cyst) develops. The size of a cyst can vary from small and pea-like to the size of a golf ball, or even bigger in some cases. The cyst may remain the same size or may slowly become bigger. The reason why a Bartholin's duct may become blocked and lead to a cyst is not clear.  Bartholin's abscess An abscess is a collection of pus that can occur with an infection. An abscess can occur in a Bartholin's gland. Sometimes an abscess develops from a Bartholin's cyst that becomes infected. Sometimes the gland itself becomes infected and forms into an abscess. Within a few days, the abscess can become the size of a hen's egg, sometimes larger, and is usually very painful.  Many types of germs (bacteria) can infect a Bartholin's cyst or gland to cause an abscess. Most are the common germs that cause skin or urine infections, such as Staphylococcus spp. and Escherichia coli. So, any woman can develop a Bartholin's abscess. Some cases are due to sexually transmitted germs such as gonorrhoea or chlamydia.   Your treatment plan is Keflex 500mg  three times daily for 5 days  Be sure to take all of the medication as directed. Stop taking any medication if you develop a rash, tongue swelling or shortness of breath. Mothers who are breast feeding should consider pumping and discarding their breast milk while on these antibiotics. However, there is no consensus that infant exposure at these doses would be harmful.  Remember that medication creams can weaken latex condoms. Marland Kitchen   HOME CARE:  Good hygiene may prevent some types of vaginosis from recurring and may relieve some symptoms:  Avoid baths, hot tubs and whirlpool spas. Rinse soap from  your outer genital area after a shower, and dry the area well to prevent irritation. Don't use scented or harsh soaps, such as those with deodorant or antibacterial action. Avoid irritants. These include scented tampons and pads. Wipe from front to back after using the toilet. Doing so avoids spreading fecal bacteria to your vagina.  Other things that may help prevent vaginosis include:  Don't douche. Your vagina doesn't require cleansing other than normal bathing. Repetitive douching disrupts the normal organisms that reside in the vagina and can actually increase your risk of vaginal infection. Douching won't clear up a vaginal infection. Use a latex condom. Both female and female latex condoms may help you avoid infections spread by sexual contact. Wear cotton underwear. Also wear pantyhose with a cotton crotch. If you feel comfortable without it, skip wearing underwear to bed. Yeast thrives in [employer] Your symptoms should improve in the next day or two.  GET HELP RIGHT AWAY IF:  You have pain in your lower abdomen ( pelvic area or over your ovaries) You develop nausea or vomiting You develop a fever Your discharge changes or worsens You have persistent pain with intercourse You develop shortness of breath, a rapid pulse, or you faint.  These symptoms could be signs of problems or infections that need to be evaluated by a medical provider now.  MAKE SURE YOU   Understand these instructions. Will watch your condition. Will get help right away if you are not doing well or get worse.  Thank you for choosing an e-visit.  Your e-visit answers were reviewed by a board certified advanced clinical practitioner to complete your personal care plan. Depending upon the condition, your plan could have included both over the counter or prescription medications.  Please review your pharmacy choice. Make sure the pharmacy is open so you can pick up prescription now. If there is a problem,  you may contact your provider through [employer] and have the prescription routed to another pharmacy.  Your safety is important to [place]. If you have drug allergies check your prescription carefully.   For the next 24 hours you can use MyChart to ask questions about today's visit, request a non-urgent call back, or ask for a work or school excuse. You will get an email in the next two days asking about your experience. I hope that your e-visit has been valuable and will speed your recovery.   I provided 5 minutes of non face-to-face time during this encounter for chart review and documentation.

## 2021-07-12 ENCOUNTER — Other Ambulatory Visit (HOSPITAL_COMMUNITY): Payer: Self-pay

## 2021-07-12 MED ORDER — GABAPENTIN 100 MG PO CAPS
100.0000 mg | ORAL_CAPSULE | Freq: Every day | ORAL | 1 refills | Status: DC
  Filled 2021-07-12: qty 90, 30d supply, fill #0

## 2021-07-12 MED ORDER — HYDROCODONE-ACETAMINOPHEN 10-325 MG PO TABS
1.0000 | ORAL_TABLET | ORAL | 0 refills | Status: DC
  Filled 2021-07-12: qty 30, 5d supply, fill #0

## 2021-07-19 ENCOUNTER — Other Ambulatory Visit (HOSPITAL_COMMUNITY): Payer: Self-pay

## 2021-07-19 MED ORDER — HYDROCODONE-ACETAMINOPHEN 10-325 MG PO TABS
1.0000 | ORAL_TABLET | ORAL | 0 refills | Status: DC
  Filled 2021-07-19: qty 30, 5d supply, fill #0

## 2021-07-27 ENCOUNTER — Other Ambulatory Visit (HOSPITAL_COMMUNITY): Payer: Self-pay

## 2021-07-27 MED ORDER — HYDROCODONE-ACETAMINOPHEN 10-325 MG PO TABS
1.0000 | ORAL_TABLET | ORAL | 0 refills | Status: DC
  Filled 2021-07-27: qty 30, 5d supply, fill #0

## 2021-08-03 ENCOUNTER — Other Ambulatory Visit (HOSPITAL_COMMUNITY): Payer: Self-pay

## 2021-08-03 MED ORDER — HYDROCODONE-ACETAMINOPHEN 10-325 MG PO TABS
1.0000 | ORAL_TABLET | ORAL | 0 refills | Status: DC
  Filled 2021-08-03: qty 30, 5d supply, fill #0

## 2021-08-09 ENCOUNTER — Other Ambulatory Visit (HOSPITAL_COMMUNITY): Payer: Self-pay

## 2021-08-09 MED ORDER — HYDROCODONE-ACETAMINOPHEN 10-325 MG PO TABS
1.0000 | ORAL_TABLET | ORAL | 0 refills | Status: DC
  Filled 2021-08-09: qty 30, 5d supply, fill #0

## 2021-08-23 ENCOUNTER — Other Ambulatory Visit (HOSPITAL_COMMUNITY): Payer: Self-pay

## 2021-08-23 MED ORDER — HYDROCODONE-ACETAMINOPHEN 10-325 MG PO TABS
1.0000 | ORAL_TABLET | ORAL | 0 refills | Status: DC
  Filled 2021-08-23: qty 30, 5d supply, fill #0

## 2022-04-04 ENCOUNTER — Encounter: Payer: Self-pay | Admitting: *Deleted

## 2022-08-02 ENCOUNTER — Emergency Department (HOSPITAL_BASED_OUTPATIENT_CLINIC_OR_DEPARTMENT_OTHER): Payer: BC Managed Care – PPO

## 2022-08-02 ENCOUNTER — Encounter (HOSPITAL_BASED_OUTPATIENT_CLINIC_OR_DEPARTMENT_OTHER): Payer: Self-pay

## 2022-08-02 ENCOUNTER — Telehealth: Payer: Self-pay | Admitting: Vascular Surgery

## 2022-08-02 ENCOUNTER — Emergency Department (HOSPITAL_BASED_OUTPATIENT_CLINIC_OR_DEPARTMENT_OTHER)
Admission: EM | Admit: 2022-08-02 | Discharge: 2022-08-02 | Discharge status: Against Medical Advice | Payer: BC Managed Care – PPO | Attending: Emergency Medicine | Admitting: Emergency Medicine

## 2022-08-02 ENCOUNTER — Other Ambulatory Visit (HOSPITAL_COMMUNITY): Payer: Self-pay

## 2022-08-02 ENCOUNTER — Other Ambulatory Visit: Payer: Self-pay

## 2022-08-02 DIAGNOSIS — Z5320 Procedure and treatment not carried out because of patient's decision for unspecified reasons: Secondary | ICD-10-CM | POA: Diagnosis not present

## 2022-08-02 DIAGNOSIS — I7774 Dissection of vertebral artery: Secondary | ICD-10-CM

## 2022-08-02 DIAGNOSIS — R519 Headache, unspecified: Secondary | ICD-10-CM | POA: Insufficient documentation

## 2022-08-02 LAB — CBC WITH DIFFERENTIAL/PLATELET
Abs Immature Granulocytes: 0.03 10*3/uL (ref 0.00–0.07)
Basophils Absolute: 0 10*3/uL (ref 0.0–0.1)
Basophils Relative: 0 %
Eosinophils Absolute: 0 10*3/uL (ref 0.0–0.5)
Eosinophils Relative: 0 %
HCT: 43.9 % (ref 36.0–46.0)
Hemoglobin: 15 g/dL (ref 12.0–15.0)
Immature Granulocytes: 0 %
Lymphocytes Relative: 12 %
Lymphs Abs: 0.9 10*3/uL (ref 0.7–4.0)
MCH: 29.3 pg (ref 26.0–34.0)
MCHC: 34.2 g/dL (ref 30.0–36.0)
MCV: 85.7 fL (ref 80.0–100.0)
Monocytes Absolute: 0.2 10*3/uL (ref 0.1–1.0)
Monocytes Relative: 3 %
Neutro Abs: 6 10*3/uL (ref 1.7–7.7)
Neutrophils Relative %: 85 %
Platelets: 257 10*3/uL (ref 150–400)
RBC: 5.12 MIL/uL — ABNORMAL HIGH (ref 3.87–5.11)
RDW: 12.1 % (ref 11.5–15.5)
WBC: 7.2 10*3/uL (ref 4.0–10.5)
nRBC: 0 % (ref 0.0–0.2)

## 2022-08-02 LAB — BASIC METABOLIC PANEL
Anion gap: 8 (ref 5–15)
BUN: 9 mg/dL (ref 6–20)
CO2: 25 mmol/L (ref 22–32)
Calcium: 9.3 mg/dL (ref 8.9–10.3)
Chloride: 105 mmol/L (ref 98–111)
Creatinine, Ser: 0.72 mg/dL (ref 0.44–1.00)
GFR, Estimated: >60 mL/min (ref >60)
Glucose, Bld: 102 mg/dL — ABNORMAL HIGH (ref 70–99)
Potassium: 3.7 mmol/L (ref 3.5–5.1)
Sodium: 138 mmol/L (ref 135–145)

## 2022-08-02 LAB — C-REACTIVE PROTEIN: CRP: <0.5 mg/dL (ref <1.0)

## 2022-08-02 LAB — SEDIMENTATION RATE: Sed Rate: 4 mm/hr (ref 0–22)

## 2022-08-02 MED ORDER — KETOROLAC TROMETHAMINE 30 MG/ML IJ SOLN
30.0000 mg | Freq: Once | INTRAMUSCULAR | Status: AC
  Administered 2022-08-02: 30 mg via INTRAVENOUS
  Filled 2022-08-02: qty 1

## 2022-08-02 MED ORDER — DIPHENHYDRAMINE HCL 50 MG/ML IJ SOLN
12.5000 mg | Freq: Once | INTRAMUSCULAR | Status: AC
  Administered 2022-08-02: 12.5 mg via INTRAVENOUS
  Filled 2022-08-02: qty 1

## 2022-08-02 MED ORDER — APIXABAN (ELIQUIS) VTE STARTER PACK (10MG AND 5MG)
ORAL_TABLET | ORAL | 0 refills | Status: DC
  Filled 2022-08-02: qty 74, 30d supply, fill #0

## 2022-08-02 MED ORDER — PROCHLORPERAZINE EDISYLATE 10 MG/2ML IJ SOLN
10.0000 mg | Freq: Once | INTRAMUSCULAR | Status: AC
  Administered 2022-08-02: 10 mg via INTRAVENOUS
  Filled 2022-08-02: qty 2

## 2022-08-02 MED ORDER — DEXAMETHASONE SODIUM PHOSPHATE 10 MG/ML IJ SOLN
10.0000 mg | Freq: Once | INTRAMUSCULAR | Status: AC
  Administered 2022-08-02: 10 mg via INTRAVENOUS
  Filled 2022-08-02: qty 1

## 2022-08-02 MED ORDER — GABAPENTIN 100 MG PO CAPS
100.0000 mg | ORAL_CAPSULE | Freq: Once | ORAL | Status: AC
  Administered 2022-08-02: 100 mg via ORAL
  Filled 2022-08-02: qty 1

## 2022-08-02 MED ORDER — IOHEXOL 350 MG/ML SOLN
100.0000 mL | Freq: Once | INTRAVENOUS | Status: AC | PRN
  Administered 2022-08-02: 75 mL via INTRAVENOUS

## 2022-08-02 NOTE — ED Triage Notes (Addendum)
Pt c/o increasing R side headache x3 days and n/v starting this morning.  Pain score 10/10.  Pt has not taken anything for pain today.  R pupil is noted to be slightly smaller than L.  Both are reactive.  Denies hitting head/injury.     Pt is waiting for neck fusion of "4-6."

## 2022-08-02 NOTE — ED Notes (Signed)
Secretary paged Vascular Surgeon, Scot Dock.

## 2022-08-02 NOTE — Telephone Encounter (Signed)
I spoke with the emergency department physician at [facility] concerning the patient who had some chronic neck pain and was seen in the emergency department there.  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  I reviewed these images.  The right vertebral artery looks fairly normal to me.  The left vertebral artery had some narrowing and irregularity but I did not see a clear-cut dissection.  Regardless, given her history and the possibility of vertebral artery dissection I recommended 3 months of anticoagulation with a follow-up CT scan in 3 months of the neck which I have arranged.  I will see her back in the office at that time.  If this looks fairly unremarkable we would likely discontinue her anticoagulation.  Gae Gallop, MD 3:38 PM

## 2022-08-02 NOTE — ED Provider Notes (Addendum)
[facility] Provider Note   CSN: [id number] Arrival date & time: 08/02/22  5681     History  Chief Complaint  Patient presents with   Headache   Emesis    Vanessa Thomas is a 42 y.o. female.  Smoker with PMH of cervical radiculopathy, aortic root dilatation, Raynaud's disease, anxiety disorder who presents with progressive worsening headache over the past 3 days.  It was not thunderclap or severe on onset.  She feels like it has been worsening over the past 3 days and feels a sharp electric type pain going across her right face only to her cheek her lower chin and upper forehead.  There is been no associated visual changes, focal weakness, numbness or tingling or loss of sensation.  She does note known cervical radiculopathy with some associated mild numbness of the right arm however this is unchanged from her baseline.  She had no traumatic injuries and she is not on any anticoagulation.  She has had no fevers, no weight loss, no other illness symptoms.  However she has had some nausea and nonbloody nonbilious emesis.  Her family was concerned for some mild changes of her right pupil slightly smaller than left and mild right sided ptosis which has since resolved.  She had taken home Percocet and ibuprofen without relief.  She denies any chest pain, abdominal pain, shortness of breath or other complaints.   Headache Associated symptoms: vomiting   Emesis Associated symptoms: headaches        Home Medications Prior to Admission medications   Medication Sig Start Date End Date Taking? Authorizing Provider  APIXABAN (ELIQUIS) VTE STARTER PACK ('10MG'$  AND '5MG'$ ) Take as directed on package: start with two-'5mg'$  tablets twice daily for 7 days. On day 8, switch to one-'5mg'$  tablet twice daily. 08/02/22  Yes Elgie Congo, MD  cephALEXin (KEFLEX) 500 MG capsule Take 1 capsule (500 mg total) by mouth 3 (three) times daily. 07/09/21   Mar Daring, PA-C  gabapentin (NEURONTIN) 100 MG capsule Take 1-3 capsules (100-300 mg total) by mouth at bedtime. 04/09/21     gabapentin (NEURONTIN) 100 MG capsule Take 1-3 capsules (100-300 mg total) by mouth at bedtime. 04/28/21     gabapentin (NEURONTIN) 100 MG capsule Take 1-3 capsules (100-300 mg total) by mouth at bedtime. 07/12/21     HYDROcodone-acetaminophen (NORCO) 10-325 MG tablet TAKE 1 TABLET BY MOUTH EVERY 6 HOURS AS NEEDED 06/09/21     HYDROcodone-acetaminophen (NORCO) 10-325 MG tablet Take 1 tablet by mouth every 4 to 6 hours as directed 06/28/21     HYDROcodone-acetaminophen (NORCO) 10-325 MG tablet Take 1 tablet by mouth every 4-6 hours as directed 08/23/21     ibuprofen (ADVIL,MOTRIN) 200 MG tablet Take 2 tablets by mouth as needed.    [provider]  methylPREDNISolone (MEDROL DOSEPAK) 4 MG TBPK tablet Follow enclosed patient instructions 04/02/21     metoprolol succinate (TOPROL-XL) 100 MG 24 hr tablet Take 1 tablet (100 mg total) by mouth 2 (two) times daily. Take with or immediately following a meal. 06/16/15   Satira Sark, MD  ondansetron (ZOFRAN-ODT) 4 MG disintegrating tablet Take 1 tablet (4 mg total) by mouth every 8 (eight) hours until directed to stop, take only as needed 01/27/21     oseltamivir (TAMIFLU) 75 MG capsule Take 1 capsule (75 mg total) by mouth 2 (two) times daily. 06/14/21   Mar Daring, PA-C  predniSONE (STERAPRED UNI-PAK 48 TAB)  10 MG (48) TBPK tablet Take as directed on package 04/09/21         Allergies    Meperidine    Review of Systems   Review of Systems  Gastrointestinal:  Positive for vomiting.  Neurological:  Positive for headaches.    Physical Exam Updated Vital Signs BP (!) 162/108 (BP Location: Right Arm)   Pulse 98   Temp 97.9 F (36.6 C) (Oral)   Resp 14   Ht 6' (1.829 m)   Wt 85.3 kg   SpO2 97%   BMI 25.50 kg/m  Physical Exam Constitutional: Alert and oriented.  Uncomfortable but nontoxic Eyes: Conjunctivae are  normal. ENT      Head: Normocephalic and atraumatic.  Tenderness and hyperesthesia to the face along the distribution of the right trigeminal region of the face, no palpable temporal artery      Nose: No congestion.      Mouth/Throat: Mucous membranes are moist.      Neck: No stridor. No meningismus Cardiovascular: S1, S2,  Normal and symmetric distal pulses are present in all extremities.Warm and well perfused. Respiratory: Normal respiratory effort.  Gastrointestinal: Soft and nontender.  Musculoskeletal: Normal range of motion in all extremities. Neurologic: Normal speech and language without aphasia. AAOx4. PERRL. EOMI without nystagmus. Face symmetrical without droop. CN II-XII intact. Normal facial sensation. Tongue midline. 5/5 strength in upper and lower extremities.  Mildly subjective reported numbness of the right upper extremity which is baseline for patient. Normal finger-to-nose. Normal gait. No focal neurologic deficits are appreciated. Skin: Skin is warm, dry and intact. No rash noted. Psychiatric: Mood and affect are normal. Speech and behavior are normal.  ED Results / Procedures / Treatments   Labs (all labs ordered are listed, but only abnormal results are displayed) Labs Reviewed  CBC WITH DIFFERENTIAL/PLATELET - Abnormal; Notable for the following components:      Result Value   RBC 5.12 (*)    All other components within normal limits  BASIC METABOLIC PANEL - Abnormal; Notable for the following components:   Glucose, Bld 102 (*)    All other components within normal limits  SEDIMENTATION RATE  C-REACTIVE PROTEIN    EKG None  Radiology CT ANGIO HEAD NECK W WO CM  Result Date: 08/02/2022 CLINICAL DATA:  Headache with trigeminal neuralgia like symptoms on the right. Slightly asymmetric pupils. EXAM: CT ANGIOGRAPHY HEAD AND NECK TECHNIQUE: Multidetector CT imaging of the head and neck was performed using the standard protocol during bolus administration of  intravenous contrast. Multiplanar CT image reconstructions and MIPs were obtained to evaluate the vascular anatomy. Carotid stenosis measurements (when applicable) are obtained utilizing NASCET criteria, using the distal internal carotid diameter as the denominator. RADIATION DOSE REDUCTION: This exam was performed according to the departmental dose-optimization program which includes automated exposure control, adjustment of the mA and/or kV according to patient size and/or use of iterative reconstruction technique. CONTRAST:  62m OMNIPAQUE IOHEXOL 350 MG/ML SOLN COMPARISON:  Same-day noncontrast CT head, MRA neck 03/19/2021 FINDINGS: CTA NECK FINDINGS Aortic arch: The imaged aortic arch is normal. The origins of the major branch vessels are patent. The subclavian arteries are patent to the level imaged. Right carotid system: The right common, internal, and external carotid arteries are patent, without hemodynamically significant stenosis or occlusion. There is no dissection or aneurysm. Left carotid system: The left common, internal, and external carotid arteries are patent, without hemodynamically significant stenosis or occlusion. There is no dissection or aneurysm. Vertebral arteries: The  right V1 and V2 segments are patent. There is moderate narrowing of the V3 segment with eccentric, crescentic hypodensity along the wall (8-151). The V3/V4 junction is normal in caliber. No raised dissection flap or pseudoaneurysm is seen. The left V1 and V2 segments are patent and normal in caliber. There is moderate to severe narrowing of the V3 segment with eccentric mural hypodensity (8-156, 7-168). The distal V3 segment is normal in caliber. These findings are new since the MRA neck from September 2022. Skeleton: There is degenerative endplate change X7-D5 without evidence of high-grade spinal canal or neural foraminal stenosis. There is no acute osseous abnormality or suspicious osseous lesion. There is no visible canal  hematoma. Other neck: The soft tissues of the neck are unremarkable. Upper chest: The imaged lung apices are clear. Review of the MIP images confirms the above findings CTA HEAD FINDINGS Anterior circulation: The intracranial ICAs are patent. The bilateral MCAs are patent, without proximal high-grade stenosis or occlusion. The bilateral ACAs are patent, without proximal high-grade stenosis or occlusion. The anterior communicating artery is normal. There is no aneurysm or AVM. Posterior circulation: The bilateral V4 segments are patent and normal in caliber. The basilar artery is patent. The major cerebellar arteries appear patent. The bilateral PCAs are patent, without proximal high-grade stenosis or occlusion. A diminutive right posterior communicating artery is identified. There is no aneurysm or AVM. Venous sinuses: Patent. Anatomic variants: None. Review of the MIP images confirms the above findings IMPRESSION: 1. 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 The distal V3 and V4 segments are normal in caliber. Consider NIR consult. 2. Otherwise normal vasculature of the head and neck. Electronically Signed   By: Valetta Mole M.D.   On: 08/02/2022 13:11   CT Head Wo Contrast  Result Date: 08/02/2022 CLINICAL DATA:  bad headache, anisocoria EXAM: CT HEAD WITHOUT CONTRAST TECHNIQUE: Contiguous axial images were obtained from the base of the skull through the vertex without intravenous contrast. RADIATION DOSE REDUCTION: This exam was performed according to the departmental dose-optimization program which includes automated exposure control, adjustment of the mA and/or kV according to patient size and/or use of iterative reconstruction technique. COMPARISON:  06/14/2015 FINDINGS:  Brain: No evidence of acute infarction, hemorrhage, hydrocephalus, extra-axial collection or mass lesion/mass effect. Vascular: No hyperdense vessel or unexpected calcification. Skull: Normal. Negative for fracture or focal lesion. Sinuses/Orbits: No acute finding. IMPRESSION: No acute intracranial process. Electronically Signed   By: Sammie Bench M.D.   On: 08/02/2022 10:43    Procedures Procedures   Medications Ordered in ED Medications  ketorolac (TORADOL) 30 MG/ML injection 30 mg (30 mg Intravenous Given 08/02/22 1115)  dexamethasone (DECADRON) injection 10 mg (10 mg Intravenous Given 08/02/22 1114)  gabapentin (NEURONTIN) capsule 100 mg (100 mg Oral Given 08/02/22 1113)  prochlorperazine (COMPAZINE) injection 10 mg (10 mg Intravenous Given 08/02/22 1114)  diphenhydrAMINE (BENADRYL) injection 12.5 mg (12.5 mg Intravenous Given 08/02/22 1114)  iohexol (OMNIPAQUE) 350 MG/ML injection 100 mL (75 mLs Intravenous Contrast Given 08/02/22 1238)    ED Course/ Medical Decision Making/ A&P Clinical Course as of 08/02/22 1823  Tue Aug 02, 2022  1530 I put out consults to neurosurgery, vascular surgery, neurology regarding this patient with what appears to be 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  Spoke to Dr. Scot Dock from vascular surgery who reviewed  patient's imaging, he was not necessarily concern for right side but did note some changes noticeable on the left.  He recommends starting patient on Eliquis starter kit or Xarelto and he will arrange for her outpatient follow-up.  I was unable to provide patient results of the imaging or plans for outpatient follow-up or starting on Eliquis as she left ED before I could reassess patient or provide further information.  I tried calling patient now without answer.  I have still sent Eliquis to her pharmacy and Dr. Scot Dock is going to get patient on list for outpatient follow-up. [VB]  5638 Attempted calling patient x 2 without any answer trying to  give patient information regarding her CTA today. IF patient returns recommend neurology consult likely MRI. [VB]  78 Was able to reach patient on phone, I explained the findings, recommended she go to [facility] ER for formal neurology consult recommendations for possible stroke workup with concern for dyesthesia right face, horners like symptoms, and bilateral vertebral artery dissections on CTA. She is agreeable and re-present. [VB]    Clinical Course User Index [VB] Elgie Congo, MD   {                            Medical Decision Making CHEVY VIRGO is a 42 y.o. female.  Smoker with PMH of cervical radiculopathy, aortic root dilatation, Raynaud's disease, anxiety disorder who presents with progressive worsening headache over the past 3 days.   Patient went for initial head CT without contrast which I personally reviewed which showed no evidence of ICH.  Regarding the patient's headache, her pain is sharp and electric-like along the trigeminal region which makes me have suspicion for trigeminal neuralgia with likely associated primary headache in nature such as migraine or tension.  ,  I do not suspect meningitis with no illness symptoms, no rash, nontoxic appearance, and no meningismus on exam. Considered temporal arteritis, however, less likely with no palpable temporal artery, no painful temporal artery, no diplopia, and normal ESR.  **HOWEVER, patient had CTA head/neck which was ordered due to associate reported neck pain and s/s suggestive of Horners as well as dyesthesia of face, although horners not necessarily appreciated on neuro exam in ED. It showed bilateral changes concerning for bilateral vertebral artery dissections/inremural hematoma new since 03/19/21. Patient never heard these results as she had eloped.  I had consulted neurosurgery, neurology, vascular. I had spoken to Dr Scot Dock of vascular recommending Eliquis or Xarelto for these. However I was unable to speak to  neurology, but I do think she should come back for formal neurology stroke consult and recommendations...tried calling patient multiple times without answer unfortunately. I had sent Eliquis Rx recommended by vascular.   Addendum: Was able to reach patient on phone, I explained the findings, recommended she go to [facility] ER for formal neurology consult recommendations for possible stroke workup with concern for dyesthesia right face, horners like symptoms, and bilateral vertebral artery dissections on CTA. She is agreeable and re-present.     Amount and/or Complexity of Data Reviewed Labs: ordered. Radiology: ordered.  Risk Prescription drug management.   Final Clinical Impression(s) / ED Diagnoses Final diagnoses:  Bad headache  Vertebral artery dissection ([place])    Rx / DC Orders ED Discharge Orders          Ordered    APIXABAN (ELIQUIS) VTE STARTER PACK ('10MG'$  AND '5MG'$ )  08/02/22 1526              Elgie Congo, MD 08/02/22 1808    Elgie Congo, MD 08/02/22 [phone]

## 2022-08-02 NOTE — ED Provider Notes (Signed)
See my formal note from earlier today. Patient left prior to me being able to consult neurology. She has horners like syndrom symptoms on right and dyesthesia of face with HA/neck pain. CTA suggested bilateral vertebral dissections and hematoma. Dr Scot Dock of vascular provided phone consult graciously, recommended eliquis starter kit and rpt CT in a couple of months. However. With her symptoms, do think she may need formal stroke workup. I was unable to consult neurology as patient eloped prior to CTA results. Was able to reach patient on phone, I explained the findings, recommended she go to [facility] ER for formal neurology consult recommendations for possible stroke workup with concern for dyesthesia right face, horners like symptoms, and bilateral vertebral artery dissections on CTA. She is agreeable and re-present.   Elgie Congo, MD 08/02/22 [phone]

## 2022-08-03 ENCOUNTER — Encounter (HOSPITAL_COMMUNITY): Payer: Self-pay | Admitting: Internal Medicine

## 2022-08-03 ENCOUNTER — Observation Stay (HOSPITAL_COMMUNITY): Payer: BC Managed Care – PPO

## 2022-08-03 ENCOUNTER — Observation Stay (HOSPITAL_COMMUNITY)
Admission: EM | Admit: 2022-08-03 | Discharge: 2022-08-04 | Disposition: A | Discharge status: Home / Self Care | Payer: BC Managed Care – PPO | Attending: Family Medicine | Admitting: Family Medicine

## 2022-08-03 ENCOUNTER — Other Ambulatory Visit: Payer: Self-pay

## 2022-08-03 ENCOUNTER — Emergency Department (HOSPITAL_COMMUNITY): Payer: BC Managed Care – PPO

## 2022-08-03 ENCOUNTER — Other Ambulatory Visit (HOSPITAL_COMMUNITY): Payer: Self-pay

## 2022-08-03 DIAGNOSIS — Z8679 Personal history of other diseases of the circulatory system: Secondary | ICD-10-CM

## 2022-08-03 DIAGNOSIS — I7774 Dissection of vertebral artery: Principal | ICD-10-CM | POA: Diagnosis present

## 2022-08-03 DIAGNOSIS — J341 Cyst and mucocele of nose and nasal sinus: Secondary | ICD-10-CM | POA: Diagnosis not present

## 2022-08-03 DIAGNOSIS — D72829 Elevated white blood cell count, unspecified: Secondary | ICD-10-CM | POA: Diagnosis present

## 2022-08-03 DIAGNOSIS — E876 Hypokalemia: Secondary | ICD-10-CM | POA: Diagnosis not present

## 2022-08-03 DIAGNOSIS — R29818 Other symptoms and signs involving the nervous system: Secondary | ICD-10-CM | POA: Diagnosis not present

## 2022-08-03 DIAGNOSIS — R9431 Abnormal electrocardiogram [ECG] [EKG]: Secondary | ICD-10-CM | POA: Diagnosis not present

## 2022-08-03 DIAGNOSIS — F17210 Nicotine dependence, cigarettes, uncomplicated: Secondary | ICD-10-CM | POA: Insufficient documentation

## 2022-08-03 DIAGNOSIS — K589 Irritable bowel syndrome without diarrhea: Secondary | ICD-10-CM

## 2022-08-03 DIAGNOSIS — Z9889 Other specified postprocedural states: Secondary | ICD-10-CM

## 2022-08-03 DIAGNOSIS — Z79899 Other long term (current) drug therapy: Secondary | ICD-10-CM | POA: Diagnosis not present

## 2022-08-03 DIAGNOSIS — I10 Essential (primary) hypertension: Secondary | ICD-10-CM | POA: Diagnosis present

## 2022-08-03 DIAGNOSIS — K581 Irritable bowel syndrome with constipation: Secondary | ICD-10-CM

## 2022-08-03 DIAGNOSIS — Z8774 Personal history of (corrected) congenital malformations of heart and circulatory system: Secondary | ICD-10-CM

## 2022-08-03 DIAGNOSIS — R519 Headache, unspecified: Secondary | ICD-10-CM | POA: Diagnosis not present

## 2022-08-03 HISTORY — DX: Dissection of vertebral artery: I77.74

## 2022-08-03 HISTORY — DX: Hypokalemia: E87.6

## 2022-08-03 HISTORY — DX: Elevated white blood cell count, unspecified: D72.829

## 2022-08-03 HISTORY — DX: Personal history of other diseases of the circulatory system: Z86.79

## 2022-08-03 HISTORY — DX: Radiculopathy, cervical region: M54.12

## 2022-08-03 HISTORY — DX: Essential (primary) hypertension: I10

## 2022-08-03 HISTORY — DX: Irritable bowel syndrome, unspecified: K58.9

## 2022-08-03 LAB — APTT: aPTT: 23 seconds — ABNORMAL LOW (ref 24–36)

## 2022-08-03 LAB — URINALYSIS, ROUTINE W REFLEX MICROSCOPIC
Bilirubin Urine: NEGATIVE
Glucose, UA: NEGATIVE mg/dL
Ketones, ur: NEGATIVE mg/dL
Leukocytes,Ua: NEGATIVE
Nitrite: NEGATIVE
Protein, ur: NEGATIVE mg/dL
RBC / HPF: >50 RBC/hpf — ABNORMAL HIGH (ref 0–5)
Specific Gravity, Urine: 1.013 (ref 1.005–1.030)
pH: 6 (ref 5.0–8.0)

## 2022-08-03 LAB — I-STAT CHEM 8, ED
BUN: 10 mg/dL (ref 6–20)
Calcium, Ion: 1.14 mmol/L — ABNORMAL LOW (ref 1.15–1.40)
Chloride: 105 mmol/L (ref 98–111)
Creatinine, Ser: 0.9 mg/dL (ref 0.44–1.00)
Glucose, Bld: 113 mg/dL — ABNORMAL HIGH (ref 70–99)
HCT: 42 % (ref 36.0–46.0)
Hemoglobin: 14.3 g/dL (ref 12.0–15.0)
Potassium: 3.1 mmol/L — ABNORMAL LOW (ref 3.5–5.1)
Sodium: 142 mmol/L (ref 135–145)
TCO2: 23 mmol/L (ref 22–32)

## 2022-08-03 LAB — GLUCOSE, CAPILLARY: Glucose-Capillary: 84 mg/dL (ref 70–99)

## 2022-08-03 LAB — RAPID URINE DRUG SCREEN, HOSP PERFORMED
Amphetamines: NOT DETECTED
Barbiturates: NOT DETECTED
Benzodiazepines: NOT DETECTED
Cocaine: NOT DETECTED
Opiates: POSITIVE — AB
Tetrahydrocannabinol: NOT DETECTED

## 2022-08-03 LAB — COMPREHENSIVE METABOLIC PANEL
ALT: 12 U/L (ref 0–44)
AST: 21 U/L (ref 15–41)
Albumin: 3.7 g/dL (ref 3.5–5.0)
Alkaline Phosphatase: 56 U/L (ref 38–126)
Anion gap: 13 (ref 5–15)
BUN: 10 mg/dL (ref 6–20)
CO2: 23 mmol/L (ref 22–32)
Calcium: 8.9 mg/dL (ref 8.9–10.3)
Chloride: 106 mmol/L (ref 98–111)
Creatinine, Ser: 1.02 mg/dL — ABNORMAL HIGH (ref 0.44–1.00)
GFR, Estimated: >60 mL/min (ref >60)
Glucose, Bld: 115 mg/dL — ABNORMAL HIGH (ref 70–99)
Potassium: 3.1 mmol/L — ABNORMAL LOW (ref 3.5–5.1)
Sodium: 142 mmol/L (ref 135–145)
Total Bilirubin: 0.7 mg/dL (ref 0.3–1.2)
Total Protein: 6.3 g/dL — ABNORMAL LOW (ref 6.5–8.1)

## 2022-08-03 LAB — HIV ANTIBODY (ROUTINE TESTING W REFLEX): HIV Screen 4th Generation wRfx: NONREACTIVE

## 2022-08-03 LAB — PROTIME-INR
INR: 1 (ref 0.8–1.2)
Prothrombin Time: 12.9 seconds (ref 11.4–15.2)

## 2022-08-03 LAB — ETHANOL: Alcohol, Ethyl (B): <10 mg/dL (ref <10)

## 2022-08-03 LAB — DIFFERENTIAL
Abs Immature Granulocytes: 0.03 10*3/uL (ref 0.00–0.07)
Basophils Absolute: 0 10*3/uL (ref 0.0–0.1)
Basophils Relative: 0 %
Eosinophils Absolute: 0 10*3/uL (ref 0.0–0.5)
Eosinophils Relative: 0 %
Immature Granulocytes: 0 %
Lymphocytes Relative: 22 %
Lymphs Abs: 2.5 10*3/uL (ref 0.7–4.0)
Monocytes Absolute: 0.9 10*3/uL (ref 0.1–1.0)
Monocytes Relative: 8 %
Neutro Abs: 8 10*3/uL — ABNORMAL HIGH (ref 1.7–7.7)
Neutrophils Relative %: 70 %

## 2022-08-03 LAB — CBC
HCT: 42.4 % (ref 36.0–46.0)
Hemoglobin: 14.7 g/dL (ref 12.0–15.0)
MCH: 30 pg (ref 26.0–34.0)
MCHC: 34.7 g/dL (ref 30.0–36.0)
MCV: 86.5 fL (ref 80.0–100.0)
Platelets: 262 10*3/uL (ref 150–400)
RBC: 4.9 MIL/uL (ref 3.87–5.11)
RDW: 12.1 % (ref 11.5–15.5)
WBC: 11.4 10*3/uL — ABNORMAL HIGH (ref 4.0–10.5)
nRBC: 0 % (ref 0.0–0.2)

## 2022-08-03 LAB — I-STAT BETA HCG BLOOD, ED (MC, WL, AP ONLY): I-stat hCG, quantitative: <5 m[IU]/mL (ref <5)

## 2022-08-03 LAB — CBG MONITORING, ED: Glucose-Capillary: 112 mg/dL — ABNORMAL HIGH (ref 70–99)

## 2022-08-03 MED ORDER — POTASSIUM CHLORIDE CRYS ER 20 MEQ PO TBCR
60.0000 meq | EXTENDED_RELEASE_TABLET | ORAL | Status: AC
  Administered 2022-08-03: 60 meq via ORAL
  Filled 2022-08-03: qty 3

## 2022-08-03 MED ORDER — HYDRALAZINE HCL 20 MG/ML IJ SOLN: 10.0000 mg | INTRAMUSCULAR | Status: DC | PRN

## 2022-08-03 MED ORDER — ACETAMINOPHEN 650 MG RE SUPP: 650.0000 mg | Freq: Four times a day (QID) | RECTAL | Status: DC | PRN

## 2022-08-03 MED ORDER — ACETAMINOPHEN 325 MG PO TABS: 650.0000 mg | ORAL_TABLET | Freq: Four times a day (QID) | ORAL | Status: DC | PRN

## 2022-08-03 MED ORDER — SODIUM CHLORIDE 0.9% FLUSH
3.0000 mL | Freq: Two times a day (BID) | INTRAVENOUS | Status: DC
  Administered 2022-08-03 – 2022-08-04 (×3): 3 mL via INTRAVENOUS

## 2022-08-03 MED ORDER — METOPROLOL SUCCINATE ER 100 MG PO TB24
100.0000 mg | ORAL_TABLET | Freq: Every day | ORAL | Status: DC
  Administered 2022-08-03: 100 mg via ORAL
  Filled 2022-08-03: qty 1

## 2022-08-03 MED ORDER — HYDROCODONE-ACETAMINOPHEN 5-325 MG PO TABS
1.0000 | ORAL_TABLET | Freq: Once | ORAL | Status: AC
  Administered 2022-08-03: 1 via ORAL
  Filled 2022-08-03: qty 1

## 2022-08-03 MED ORDER — APIXABAN 5 MG PO TABS
10.0000 mg | ORAL_TABLET | Freq: Two times a day (BID) | ORAL | Status: DC
  Administered 2022-08-03 – 2022-08-04 (×3): 10 mg via ORAL
  Filled 2022-08-03 (×3): qty 2

## 2022-08-03 MED ORDER — LORAZEPAM 2 MG/ML IJ SOLN
0.5000 mg | Freq: Once | INTRAMUSCULAR | Status: AC | PRN
  Administered 2022-08-03: 0.5 mg via INTRAVENOUS
  Filled 2022-08-03: qty 1

## 2022-08-03 MED ORDER — DIPHENHYDRAMINE HCL 50 MG/ML IJ SOLN
12.5000 mg | Freq: Once | INTRAMUSCULAR | Status: AC
  Administered 2022-08-03: 12.5 mg via INTRAVENOUS
  Filled 2022-08-03: qty 1

## 2022-08-03 MED ORDER — ALBUTEROL SULFATE (2.5 MG/3ML) 0.083% IN NEBU: 2.5000 mg | INHALATION_SOLUTION | Freq: Four times a day (QID) | RESPIRATORY_TRACT | Status: DC | PRN

## 2022-08-03 MED ORDER — OXYCODONE-ACETAMINOPHEN 10-325 MG PO TABS: 1.0000 | ORAL_TABLET | Freq: Four times a day (QID) | ORAL | Status: DC

## 2022-08-03 MED ORDER — OXYCODONE HCL 5 MG PO TABS
5.0000 mg | ORAL_TABLET | Freq: Four times a day (QID) | ORAL | Status: DC | PRN
  Administered 2022-08-03 – 2022-08-04 (×4): 5 mg via ORAL
  Filled 2022-08-03 (×4): qty 1

## 2022-08-03 MED ORDER — OXYCODONE-ACETAMINOPHEN 5-325 MG PO TABS
1.0000 | ORAL_TABLET | Freq: Four times a day (QID) | ORAL | Status: DC | PRN
  Administered 2022-08-03 – 2022-08-04 (×4): 1 via ORAL
  Filled 2022-08-03 (×4): qty 1

## 2022-08-03 MED ORDER — APIXABAN 5 MG PO TABS: 5.0000 mg | ORAL_TABLET | Freq: Two times a day (BID) | ORAL | Status: DC

## 2022-08-03 MED ORDER — POLYETHYLENE GLYCOL 3350 17 G PO PACK
17.0000 g | PACK | Freq: Every day | ORAL | Status: DC
  Filled 2022-08-03: qty 1

## 2022-08-03 MED ORDER — SODIUM CHLORIDE 0.9 % IV SOLN
12.5000 mg | Freq: Once | INTRAVENOUS | Status: AC
  Administered 2022-08-03: 12.5 mg via INTRAVENOUS
  Filled 2022-08-03: qty 0.5

## 2022-08-03 MED ORDER — PROCHLORPERAZINE EDISYLATE 10 MG/2ML IJ SOLN
10.0000 mg | Freq: Once | INTRAMUSCULAR | Status: AC
  Administered 2022-08-03: 10 mg via INTRAVENOUS
  Filled 2022-08-03: qty 2

## 2022-08-03 NOTE — Discharge Instructions (Signed)
  ________________________________________________________________________________________________________________________________________ Information on my medicine - ELIQUIS (apixaban)  This medication education was reviewed with me or my healthcare representative as part of my discharge preparation.   Why was Eliquis prescribed for you? Eliquis was prescribed to treat blood clots that may have been found in the veins of your legs (deep vein thrombosis) or in your lungs (pulmonary embolism) and to reduce the risk of them occurring again.  What do You need to know about Eliquis ? The starting dose is 10 mg (two 5 mg tablets) taken TWICE daily for the FIRST SEVEN (7) DAYS, then on 08/10/22  the dose is reduced to ONE 5 mg tablet taken TWICE daily.  Eliquis may be taken with or without food.   Try to take the dose about the same time in the morning and in the evening. If you have difficulty swallowing the tablet whole please discuss with your pharmacist how to take the medication safely.  Take Eliquis exactly as prescribed and DO NOT stop taking Eliquis without talking to the doctor who prescribed the medication.  Stopping may increase your risk of developing a new blood clot.  Refill your prescription before you run out.  After discharge, you should have regular check-up appointments with your healthcare provider that is prescribing your Eliquis.    What do you do if you miss a dose? If a dose of ELIQUIS is not taken at the scheduled time, take it as soon as possible on the same day and twice-daily administration should be resumed. The dose should not be doubled to make up for a missed dose.  Important Safety Information A possible side effect of Eliquis is bleeding. You should call your healthcare provider right away if you experience any of the following: Bleeding from an injury or your nose that does not stop. Unusual colored urine (red or dark brown) or unusual colored stools (red  or black). Unusual bruising for unknown reasons. A serious fall or if you hit your head (even if there is no bleeding).  Some medicines may interact with Eliquis and might increase your risk of bleeding or clotting while on Eliquis. To help avoid this, consult your healthcare provider or pharmacist prior to using any new prescription or non-prescription medications, including herbals, vitamins, non-steroidal anti-inflammatory drugs (NSAIDs) and supplements.  This website has more information on Eliquis (apixaban): http://[email or web address].com/eliquis/home
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
## 2022-08-03 NOTE — ED Notes (Signed)
Called [facility] to activate a code stroke per bowie tran PA

## 2022-08-03 NOTE — ED Notes (Signed)
Messaged pharmacy about tubing down pt phenergan
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
## 2022-08-03 NOTE — ED Provider Triage Note (Signed)
Emergency Medicine Provider Triage Evaluation Note  Vanessa Thomas , a 42 y.o. female  was evaluated in triage.  Pt complains of headache. Report gradual onset of headache x 3 days.  Seen in the ER yesterday for same.  Had neck CTA showing bilateral vertebral artery dissection but unfortunately left AMA prior further evaluation.  Was notified of result last night.  Came in today for further eval.  However pt report new numbness on R side of face as well as dizziness. LKW 1 hr ago.    Review of Systems  Positive: As above Negative: As above  Physical Exam  BP (!) 160/97   Pulse 93   Temp 97.9 F (36.6 C)   Resp 19   Ht 6' (1.829 m)   Wt 83.9 kg   SpO2 99%   BMI 25.09 kg/m  Gen:   Awake, no distress   Resp:  Normal effort  MSK:   Moves extremities without difficulty  Other:  Subjected decreased sensation to R side of face and R arm drift. Normal gait.  Normal grip strength  Medical Decision Making  Medically screening exam initiated at 9:36 AM.  Appropriate orders placed.  Keiarah L Buchholz was informed that the remainder of the evaluation will be completed by another provider, this initial triage assessment does not replace that evaluation, and the importance of remaining in the ED until their evaluation is complete.  Code stroke activated.  Pt to move to the main room STAT. Care discussed with Dr. Truett Mainland.   .Critical Care  Performed by: Domenic Moras, PA-C Authorized by: Domenic Moras, PA-C   Critical care provider statement:    Critical care time (minutes):  30   Critical care was time spent personally by me on the following activities:  Development of treatment plan with patient or surrogate, discussions with consultants, evaluation of patient's response to treatment, examination of patient, ordering and review of laboratory studies, ordering and review of radiographic studies, ordering and performing treatments and interventions, pulse oximetry, re-evaluation of patient's condition  and review of old charts     Domenic Moras, [place] 08/03/22 [phone]

## 2022-08-03 NOTE — Progress Notes (Signed)
ANTICOAGULATION CONSULT NOTE - Initial Consult  Pharmacy Consult for Apixaban Indication:  bilateral vertebral artery dissection  Allergies  Allergen Reactions   Meperidine Other (See Comments)    Alters mental status, rapid heart rate     Patient Measurements: Height: 6' (182.9 cm) Weight: 92.2 kg (203 lb 4.2 oz) IBW/kg (Calculated) : 73.1  Vital Signs: Temp: 98.3 F (36.8 C) (01/24 1004) BP: 167/100 (01/24 1004) Pulse Rate: 82 (01/24 1004)  Labs: Recent Labs    08/02/22 1106 08/03/22 0937 08/03/22 0955  HGB 15.0 14.7 14.3  HCT 43.9 42.4 42.0  PLT 257 262  --   APTT  --  23*  --   LABPROT  --  12.9  --   INR  --  1.0  --   CREATININE 0.72  --  0.90    Estimated Creatinine Clearance: 105.9 mL/min (by C-G formula based on SCr of 0.9 mg/dL).   Medical History: Past Medical History:  Diagnosis Date   Aortic root dilatation (HCC)    DORV, VSD (double-outlet right ventricle, ventricular septal defect)    Status post surgical correction   Generalized anxiety disorder    History of colonic polyps    History of nephrolithiasis    Inappropriate sinus tachycardia    Status post sinus node modification    Kidney stones    Paroxysmal supraventricular tachycardia    No dual AV pathway physiology by EP study    Raynauds disease     Medications:  Scheduled:   apixaban  10 mg Oral BID   Followed by   Derrill Memo ON 08/10/2022] apixaban  5 mg Oral BID   HYDROcodone-acetaminophen  1 tablet Oral Once    Assessment: 42 yo F presented with R-sided facial tingling on 1/24 after being seen for R-sided headache at [facility] ED on 08/02/22 and found to have bilateral vertebral artery dissections. Neurology was consulted and recommended initiating apixaban for anticoagulation on 1/23 to EDP. Apixaban starter pack was e-scribed to pt's pharmacy late on 08/02/22 after pharmacy closed, pt never started apixaban or picked up from pharmacy prior to presenting to [facility] ED on 08/03/22. Pharmacy  consulted to dose apixaban for bilateral vertebral dissection.   Hgb 14.3, Plt 262 - stable No s/sx of bleeding CT Head: no signs of bleed  Goal of Therapy:  Monitor platelets by anticoagulation protocol: Yes   Plan:  Start apixaban '10mg'$  BID x 7 days, followed by '5mg'$  BID thereafter Monitor daily CBC and for s/sx of bleeding Note: apixaban copay is $50/mo, patient is eligible for co-pay assistance Pharmacy to complete apixaban education with patient prior to discharge  Luisa Hart, PharmD, BCPS Clinical Pharmacist 08/03/2022 10:38 AM   Please refer to [facility] for pharmacy phone number

## 2022-08-03 NOTE — ED Provider Notes (Addendum)
[place] Provider Note   CSN: [id number] Arrival date & time: 08/03/22  2094  An emergency department physician performed an initial assessment on this suspected stroke patient at [phone].  History  Chief Complaint  Patient presents with   Headache   abnormal CT scan   Dizziness    Vanessa Thomas is a 42 y.o. female.  42 year old female with prior medical history as detailed below presents for evaluation.  Patient seen at [facility] ED yesterday.  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  Patient was advised to come to the ED here at [facility] for neuro evaluation and admission.  Patient had left the ED prior to completion of ED workup at [place].  She was contacted over the phone and advised to come to [facility] for evaluation.  Patient seen by neurology on arrival.  Repeat noncontrast CT of the brain does not show acute change.  Patient will require anticoagulation and admission per Neuro.     Headache Associated symptoms: dizziness   Dizziness Associated symptoms: headaches        Home Medications Prior to Admission medications   Medication Sig Start Date End Date Taking? Authorizing Provider  APIXABAN Arne Cleveland) VTE STARTER PACK ('10MG'$  AND '5MG'$ ) Take as directed on package: start with two-'5mg'$  tablets twice daily for 7 days. On day 8, switch to one-'5mg'$  tablet twice daily. 08/02/22   Elgie Congo, MD  cephALEXin (KEFLEX) 500 MG capsule Take 1 capsule (500 mg total) by mouth 3 (three) times daily. 07/09/21   Mar Daring, PA-C  gabapentin (NEURONTIN) 100 MG capsule Take 1-3 capsules (100-300 mg total) by mouth at bedtime. 04/09/21     gabapentin (NEURONTIN) 100 MG capsule Take 1-3 capsules (100-300 mg total) by mouth at bedtime. 04/28/21     gabapentin (NEURONTIN) 100 MG capsule Take 1-3 capsules (100-300 mg total) by mouth at bedtime. 07/12/21      HYDROcodone-acetaminophen (NORCO) 10-325 MG tablet TAKE 1 TABLET BY MOUTH EVERY 6 HOURS AS NEEDED 06/09/21     HYDROcodone-acetaminophen (NORCO) 10-325 MG tablet Take 1 tablet by mouth every 4 to 6 hours as directed 06/28/21     HYDROcodone-acetaminophen (NORCO) 10-325 MG tablet Take 1 tablet by mouth every 4-6 hours as directed 08/23/21     ibuprofen (ADVIL,MOTRIN) 200 MG tablet Take 2 tablets by mouth as needed.    [provider]  methylPREDNISolone (MEDROL DOSEPAK) 4 MG TBPK tablet Follow enclosed patient instructions 04/02/21     metoprolol succinate (TOPROL-XL) 100 MG 24 hr tablet Take 1 tablet (100 mg total) by mouth 2 (two) times daily. Take with or immediately following a meal. 06/16/15   Satira Sark, MD  ondansetron (ZOFRAN-ODT) 4 MG disintegrating tablet Take 1 tablet (4 mg total) by mouth every 8 (eight) hours until directed to stop, take only as needed 01/27/21     oseltamivir (TAMIFLU) 75 MG capsule Take 1 capsule (75 mg total) by mouth 2 (two) times daily. 06/14/21   Mar Daring, PA-C  predniSONE (STERAPRED UNI-PAK 48 TAB) 10 MG (48) TBPK tablet Take as directed on package 04/09/21         Allergies    Meperidine    Review of Systems   Review of Systems  Neurological:  Positive for dizziness and headaches.  All other systems reviewed and are negative.   Physical Exam Updated Vital Signs BP (!) 167/100   Pulse 82   Temp 98.3  F (36.8 C)   Resp 18   Ht 6' (1.829 m)   Wt 92.2 kg   SpO2 98%   BMI 27.57 kg/m  Physical Exam Vitals and nursing note reviewed.  Constitutional:      General: She is not in acute distress.    Appearance: Normal appearance. She is well-developed.  HENT:     Head: Normocephalic and atraumatic.  Eyes:     General: No visual field deficit.    Conjunctiva/sclera: Conjunctivae normal.     Pupils: Pupils are equal, round, and reactive to light.  Cardiovascular:     Rate and Rhythm: Normal rate and regular rhythm.      Heart sounds: Normal heart sounds.  Pulmonary:     Effort: Pulmonary effort is normal. No respiratory distress.     Breath sounds: Normal breath sounds.  Abdominal:     General: There is no distension.     Palpations: Abdomen is soft.     Tenderness: There is no abdominal tenderness.  Musculoskeletal:        General: No deformity. Normal range of motion.     Cervical back: Normal range of motion and neck supple.  Skin:    General: Skin is warm and dry.  Neurological:     General: No focal deficit present.     Mental Status: She is alert and oriented to person, place, and time.     Cranial Nerves: No cranial nerve deficit or dysarthria.     Sensory: No sensory deficit.     Motor: No weakness.     ED Results / Procedures / Treatments   Labs (all labs ordered are listed, but only abnormal results are displayed) Labs Reviewed  APTT - Abnormal; Notable for the following components:      Result Value   aPTT 23 (*)    All other components within normal limits  CBC - Abnormal; Notable for the following components:   WBC 11.4 (*)    All other components within normal limits  DIFFERENTIAL - Abnormal; Notable for the following components:   Neutro Abs 8.0 (*)    All other components within normal limits  I-STAT CHEM 8, ED - Abnormal; Notable for the following components:   Potassium 3.1 (*)    Glucose, Bld 113 (*)    Calcium, Ion 1.14 (*)    All other components within normal limits  CBG MONITORING, ED - Abnormal; Notable for the following components:   Glucose-Capillary 112 (*)    All other components within normal limits  PROTIME-INR  ETHANOL  COMPREHENSIVE METABOLIC PANEL  RAPID URINE DRUG SCREEN, HOSP PERFORMED  URINALYSIS, ROUTINE W REFLEX MICROSCOPIC  I-STAT BETA HCG BLOOD, ED ([facility], [facility], [facility] ONLY)    EKG EKG Interpretation  Date/Time:  Wednesday August 03 2022 10:07:19 EST Ventricular Rate:  81 PR Interval:  201 QRS Duration: 162 QT Interval:  434 QTC  Calculation: 504 R Axis:   85 Text Interpretation: Sinus rhythm Left atrial enlargement Right bundle branch block Confirmed by Dene Gentry [phone]) on 08/03/2022 10:11:22 AM  Radiology CT HEAD CODE STROKE WO CONTRAST  Result Date: 08/03/2022 CLINICAL DATA:  Code stroke.  Neuro deficit, acute, stroke suspected EXAM: CT HEAD WITHOUT CONTRAST TECHNIQUE: Contiguous axial images were obtained from the base of the skull through the vertex without intravenous contrast. RADIATION DOSE REDUCTION: This exam was performed according to the departmental dose-optimization program which includes automated exposure control, adjustment of the mA and/or kV according to patient size  and/or use of iterative reconstruction technique. COMPARISON:  None Available. FINDINGS: Brain: No evidence of acute large vascular territory infarction, hemorrhage, hydrocephalus, extra-axial collection or mass lesion/mass effect. Vascular: No hyperdense vessel identified. Skull: No acute fracture. Sinuses/Orbits: Right maxillary sinus retention cyst. Otherwise, clear sinuses. No acute orbital findings. Other: No mastoid effusions. ASPECTS [facility] Stroke Program Early CT Score) total score (0-10 with 10 being normal): 10. IMPRESSION: 1. No evidence of acute intracranial abnormality. 2. ASPECTS is 10. Electronically Signed   By: Margaretha Sheffield M.D.   On: 08/03/2022 09:59   CT ANGIO HEAD NECK W WO CM  Result Date: 08/02/2022 CLINICAL DATA:  Headache with trigeminal neuralgia like symptoms on the right. Slightly asymmetric pupils. EXAM: CT ANGIOGRAPHY HEAD AND NECK TECHNIQUE: Multidetector CT imaging of the head and neck was performed using the standard protocol during bolus administration of intravenous contrast. Multiplanar CT image reconstructions and MIPs were obtained to evaluate the vascular anatomy. Carotid stenosis measurements (when applicable) are obtained utilizing NASCET criteria, using the distal internal carotid diameter as the  denominator. RADIATION DOSE REDUCTION: This exam was performed according to the departmental dose-optimization program which includes automated exposure control, adjustment of the mA and/or kV according to patient size and/or use of iterative reconstruction technique. CONTRAST:  70m OMNIPAQUE IOHEXOL 350 MG/ML SOLN COMPARISON:  Same-day noncontrast CT head, MRA neck 03/19/2021 FINDINGS: CTA NECK FINDINGS Aortic arch: The imaged aortic arch is normal. The origins of the major branch vessels are patent. The subclavian arteries are patent to the level imaged. Right carotid system: The right common, internal, and external carotid arteries are patent, without hemodynamically significant stenosis or occlusion. There is no dissection or aneurysm. Left carotid system: The left common, internal, and external carotid arteries are patent, without hemodynamically significant stenosis or occlusion. There is no dissection or aneurysm. Vertebral arteries: The right V1 and V2 segments are patent. There is moderate narrowing of the V3 segment with eccentric, crescentic hypodensity along the wall (8-151). The V3/V4 junction is normal in caliber. No raised dissection flap or pseudoaneurysm is seen. The left V1 and V2 segments are patent and normal in caliber. There is moderate to severe narrowing of the V3 segment with eccentric mural hypodensity (8-156, 7-168). The distal V3 segment is normal in caliber. These findings are new since the MRA neck from September 2022. Skeleton: There is degenerative endplate change CG2-X5without evidence of high-grade spinal canal or neural foraminal stenosis. There is no acute osseous abnormality or suspicious osseous lesion. There is no visible canal hematoma. Other neck: The soft tissues of the neck are unremarkable. Upper chest: The imaged lung apices are clear. Review of the MIP images confirms the above findings CTA HEAD FINDINGS Anterior circulation: The intracranial ICAs are patent. The  bilateral MCAs are patent, without proximal high-grade stenosis or occlusion. The bilateral ACAs are patent, without proximal high-grade stenosis or occlusion. The anterior communicating artery is normal. There is no aneurysm or AVM. Posterior circulation: The bilateral V4 segments are patent and normal in caliber. The basilar artery is patent. The major cerebellar arteries appear patent. The bilateral PCAs are patent, without proximal high-grade stenosis or occlusion. A diminutive right posterior communicating artery is identified. There is no aneurysm or AVM. Venous sinuses: Patent. Anatomic variants: None. Review of the MIP images confirms the above findings IMPRESSION: 1. Narrowing of the bilateral V3 segments (moderate on the right and moderate to severe on the left) with eccentric mural hypodensity and luminal irregularity concerning for bilateral vertebral  artery dissections/intramural hematomas, new since 03/19/2021 but age-indeterminate. The distal V3 and V4 segments are normal in caliber. Consider NIR consult. 2. Otherwise normal vasculature of the head and neck. Electronically Signed   By: Valetta Mole M.D.   On: 08/02/2022 13:11   CT Head Wo Contrast  Result Date: 08/02/2022 CLINICAL DATA:  bad headache, anisocoria EXAM: CT HEAD WITHOUT CONTRAST TECHNIQUE: Contiguous axial images were obtained from the base of the skull through the vertex without intravenous contrast. RADIATION DOSE REDUCTION: This exam was performed according to the departmental dose-optimization program which includes automated exposure control, adjustment of the mA and/or kV according to patient size and/or use of iterative reconstruction technique. COMPARISON:  06/14/2015 FINDINGS: Brain: No evidence of acute infarction, hemorrhage, hydrocephalus, extra-axial collection or mass lesion/mass effect. Vascular: No hyperdense vessel or unexpected calcification. Skull: Normal. Negative for fracture or focal lesion. Sinuses/Orbits: No  acute finding. IMPRESSION: No acute intracranial process. Electronically Signed   By: Sammie Bench M.D.   On: 08/02/2022 10:43    Procedures Procedures    Medications Ordered in ED Medications - No data to display  ED Course/ Medical Decision Making/ A&P                             Medical Decision Making Amount and/or Complexity of Data Reviewed Radiology: ordered.  Risk Prescription drug management. Decision regarding hospitalization.    Medical Screen Complete  This patient presented to the ED with complaint of bilateral vertebral artery dissection.  This complaint involves an extensive number of treatment options. The initial differential diagnosis includes, but is not limited to, bilateral vertebral artery dissection  This presentation is: Acute, Chronic, Self-Limited, Previously Undiagnosed, Uncertain Prognosis, Complicated, Systemic Symptoms, and Threat to Life/Bodily Function  Patient with bilateral vertebral artery dissection found on CT angio imaging performed yesterday.  Patient seen by neuro on arrival here in the ED.  Patient to be admitted for anticoagulation and further workup.  Neuro team is following in consultation.  Hospitalist service made aware of case.  Additional history obtained:  External records from outside sources obtained and reviewed including prior ED visits and prior Inpatient records.    Lab Tests:  I ordered and personally interpreted labs.  The pertinent results include:  CBC CMP ISTAT 8 INR   Imaging Studies ordered:  I ordered imaging studies including ct head  I independently visualized and interpreted obtained imaging which showed nad I agree with the radiologist interpretation.   Cardiac Monitoring:  The patient was maintained on a cardiac monitor.  I personally viewed and interpreted the cardiac monitor which showed an underlying rhythm of: NSR   Medicines ordered:  I ordered medication including Eliquis   for anticoagulation  Reevaluation of the patient after these medicines showed that the patient: stayed the same   Problem List / ED Course:  Bilateral Vertebral Artery Dissection   Reevaluation:  After the interventions noted above, I reevaluated the patient and found that they have: staye the same  Disposition:  After consideration of the diagnostic results and the patients response to treatment, I feel that the patent would benefit from admission.    CRITICAL CARE Performed by: Valarie Merino   Total critical care time: 30 minutes  Critical care time was exclusive of separately billable procedures and treating other patients.  Critical care was necessary to treat or prevent imminent or life-threatening deterioration.  Critical care was time spent personally by  me on the following activities: development of treatment plan with patient and/or surrogate as well as nursing, discussions with consultants, evaluation of patient's response to treatment, examination of patient, obtaining history from patient or surrogate, ordering and performing treatments and interventions, ordering and review of laboratory studies, ordering and review of radiographic studies, pulse oximetry and re-evaluation of patient's condition.         Final Clinical Impression(s) / ED Diagnoses Final diagnoses:  Vertebral artery dissection [facility])    Rx / DC Orders ED Discharge Orders     None         Valarie Merino, MD 08/03/22 1037    Valarie Merino, MD 08/03/22 1055    Valarie Merino, MD 08/03/22 1055
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
## 2022-08-03 NOTE — ED Triage Notes (Addendum)
Pt. Stated, I went to [facility] yesterday for right side headache on Saturday evening and progressively got worse.. I got a call that statred I need to come to ER due to an abnormal CT scan of my head. Today which is new Im dizzy.

## 2022-08-03 NOTE — Code Documentation (Signed)
Vanessa Thomas is a 42 yr old female w a PMH of cervical radiculopathy and anxiety presenting to [facility] on 08/03/2022 with a chief complaint of h/a and dizziness. Pt has had H/A for several days and was evaluated at [facility] yesterday. 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 She left before neurology consultation, but was instructed to return. She worsened today at around 0830 (she became dizzy). Code stroke activated in triage. She is not currently on any blood thinners.    Pt to CT with stroke team. NIHSS 0, positive h/a and dizziness. Rt pupil smaller than left. Rt facial paresthesia. Per Dr. Cheral Marker, [facility] neg for acute hemorrhage. Pt to ED room 28 where her workup will continue. She will need q 2 hr VS and NIHSS. Bedside handoff with Musician. Pt not a candidate for thrombolytic as NIHSS 0, days of symptoms. Not eligible for thrombectomy due to LVO negative exam and yesterday's imaging.

## 2022-08-03 NOTE — ED Notes (Signed)
Checked patient cbg it was 57 notified RN of blood sugar patient is resting with call brell in reach and nurses at bedside

## 2022-08-03 NOTE — TOC Benefit Eligibility Note (Signed)
Patient Teacher, English as a foreign language completed.    The patient is currently admitted and upon discharge could be taking Eliquis 5 mg.  The current 30 day co-pay is $50.00.   The patient is insured through  [employer] [place], [place] Patient [place] Patient Advocate Team Direct Number: [phone]  Fax: [phone]

## 2022-08-03 NOTE — ED Notes (Signed)
Pt transported to MRI with Pt transport tech

## 2022-08-04 ENCOUNTER — Observation Stay (HOSPITAL_BASED_OUTPATIENT_CLINIC_OR_DEPARTMENT_OTHER): Payer: BC Managed Care – PPO

## 2022-08-04 DIAGNOSIS — I6389 Other cerebral infarction: Secondary | ICD-10-CM

## 2022-08-04 DIAGNOSIS — I7774 Dissection of vertebral artery: Secondary | ICD-10-CM | POA: Diagnosis not present

## 2022-08-04 DIAGNOSIS — G459 Transient cerebral ischemic attack, unspecified: Secondary | ICD-10-CM | POA: Diagnosis not present

## 2022-08-04 LAB — GLUCOSE, CAPILLARY: Glucose-Capillary: 89 mg/dL (ref 70–99)

## 2022-08-04 LAB — ECHOCARDIOGRAM COMPLETE
AR max vel: 3.44 cm2
AV Area VTI: 3.32 cm2
AV Area mean vel: 3.21 cm2
AV Mean grad: 5 mmHg
AV Peak grad: 8.5 mmHg
Ao pk vel: 1.46 m/s
Area-P 1/2: 3.48 cm2
Height: 72 in
S' Lateral: 3.1 cm
Weight: 3252.23 oz

## 2022-08-04 LAB — LIPID PANEL
Cholesterol: 193 mg/dL (ref 0–200)
HDL: 66 mg/dL (ref >40)
LDL Cholesterol: 102 mg/dL — ABNORMAL HIGH (ref 0–99)
Total CHOL/HDL Ratio: 2.9 RATIO
Triglycerides: 127 mg/dL (ref <150)
VLDL: 25 mg/dL (ref 0–40)

## 2022-08-04 LAB — HEMOGLOBIN A1C
Hgb A1c MFr Bld: 5 % (ref 4.8–5.6)
Mean Plasma Glucose: 96.8 mg/dL

## 2022-08-04 MED ORDER — PROCHLORPERAZINE EDISYLATE 10 MG/2ML IJ SOLN
10.0000 mg | Freq: Four times a day (QID) | INTRAMUSCULAR | Status: DC | PRN
  Administered 2022-08-04: 10 mg via INTRAVENOUS
  Filled 2022-08-04: qty 2

## 2022-08-04 MED ORDER — ATORVASTATIN CALCIUM 10 MG PO TABS
20.0000 mg | ORAL_TABLET | Freq: Every day | ORAL | Status: DC
  Administered 2022-08-04: 20 mg via ORAL
  Filled 2022-08-04: qty 2

## 2022-08-04 MED ORDER — APIXABAN (ELIQUIS) VTE STARTER PACK (10MG AND 5MG)
ORAL_TABLET | ORAL | 0 refills | Status: DC
  Filled 2022-08-09: qty 74, 30d supply, fill #0

## 2022-08-04 MED ORDER — PROCHLORPERAZINE MALEATE 10 MG PO TABS: 10.0000 mg | ORAL_TABLET | Freq: Four times a day (QID) | ORAL | 0 refills | Status: DC | PRN

## 2022-08-04 MED ORDER — ATORVASTATIN CALCIUM 20 MG PO TABS: 20.0000 mg | ORAL_TABLET | Freq: Every day | ORAL | 0 refills | Status: AC

## 2022-08-04 NOTE — Progress Notes (Signed)
Echocardiogram 2D Echocardiogram has been performed.  Vanessa Thomas 08/04/2022, 4:39 PM

## 2022-08-04 NOTE — Plan of Care (Signed)
VeRn reviewed AVS with pt and family. They stated they had no questions at this time.  Problem: Education: Goal: Knowledge of General Education information will improve Description: Including pain rating scale, medication(s)/side effects and non-pharmacologic comfort measures Outcome: Adequate for Discharge   Problem: Health Behavior/Discharge Planning: Goal: Ability to manage health-related needs will improve Outcome: Adequate for Discharge   Problem: Clinical Measurements: Goal: Ability to maintain clinical measurements within normal limits will improve Outcome: Adequate for Discharge Goal: Will remain free from infection Outcome: Adequate for Discharge Goal: Diagnostic test results will improve Outcome: Adequate for Discharge Goal: Respiratory complications will improve Outcome: Adequate for Discharge Goal: Cardiovascular complication will be avoided Outcome: Adequate for Discharge   Problem: Activity: Goal: Risk for activity intolerance will decrease Outcome: Adequate for Discharge   Problem: Nutrition: Goal: Adequate nutrition will be maintained Outcome: Adequate for Discharge   Problem: Coping: Goal: Level of anxiety will decrease Outcome: Adequate for Discharge   Problem: Elimination: Goal: Will not experience complications related to bowel motility Outcome: Adequate for Discharge Goal: Will not experience complications related to urinary retention Outcome: Adequate for Discharge   Problem: Pain Managment: Goal: General experience of comfort will improve Outcome: Adequate for Discharge   Problem: Safety: Goal: Ability to remain free from injury will improve Outcome: Adequate for Discharge   Problem: Skin Integrity: Goal: Risk for impaired skin integrity will decrease Outcome: Adequate for Discharge

## 2022-08-04 NOTE — Progress Notes (Signed)
STROKE TEAM PROGRESS NOTE   INTERVAL HISTORY Her mother and friend are at the bedside; updated on assessment and plan of care.  No acute events overnight. No neuro deficits seen.  Patient c/o slight headache, states that compazine is providing relief.    Vitals:   08/03/22 2346 08/04/22 0314 08/04/22 0733 08/04/22 1142  BP: 121/82 133/76 (!) 144/90 (!) 153/96  Pulse: 67 65 71 72  Resp: '18 18 18 18  '$ Temp: 98.3 F (36.8 C) 98.6 F (37 C) 98.3 F (36.8 C) 97.8 F (36.6 C)  TempSrc: Oral  Oral Oral  SpO2: 99% 100% 99% 98%  Weight:      Height:       CBC:  Recent Labs  Lab 08/02/22 1106 08/03/22 0937 08/03/22 0955  WBC 7.2 11.4*  --   NEUTROABS 6.0 8.0*  --   HGB 15.0 14.7 14.3  HCT 43.9 42.4 42.0  MCV 85.7 86.5  --   PLT 257 262  --    Basic Metabolic Panel:  Recent Labs  Lab 08/02/22 1106 08/03/22 0937 08/03/22 0955  NA 138 142 142  K 3.7 3.1* 3.1*  CL 105 106 105  CO2 25 23  --   GLUCOSE 102* 115* 113*  BUN '9 10 10  '$ CREATININE 0.72 1.02* 0.90  CALCIUM 9.3 8.9  --    Lipid Panel:  Recent Labs  Lab 08/03/22 0844  CHOL 193  TRIG 127  HDL 66  CHOLHDL 2.9  VLDL 25  LDLCALC 102*   HgbA1c:  Recent Labs  Lab 08/04/22 0844  HGBA1C 5.0   Urine Drug Screen:  Recent Labs  Lab 08/03/22 1536  LABOPIA POSITIVE*  COCAINSCRNUR NONE DETECTED  LABBENZ NONE DETECTED  AMPHETMU NONE DETECTED  THCU NONE DETECTED  LABBARB NONE DETECTED    Alcohol Level  Recent Labs  Lab 08/03/22 0937  ETH <10    IMAGING past 24 hours No results found.  PHYSICAL EXAM  Temp:  [97.8 F (36.6 C)-98.6 F (37 C)] 97.8 F (36.6 C) (01/25 1142) Pulse Rate:  [65-82] 72 (01/25 1142) Resp:  [14-18] 18 (01/25 1142) BP: (121-153)/(76-101) 153/96 (01/25 1142) SpO2:  [95 %-100 %] 98 % (01/25 1142)  General - Well nourished, well developed, in no apparent distress. Cardiovascular - Regular rhythm and rate.  Mental Status -  Level of arousal and orientation to time, place,  and person were intact. Language including expression, naming, repetition, comprehension was assessed and found intact. Attention span and concentration were normal. Recent and remote memory were intact.  Cranial Nerves II - XII - II - Visual field intact OU. III, IV, VI - Extraocular movements intact. V - Facial sensation intact bilaterally. VII - Facial movement intact bilaterally. VIII - Hearing intact to voice.  X - Palate elevates symmetrically. XI - Shoulder shrug intact and symmetric XII - Tongue protrusion intact.  Motor Strength - 5/5 in all extremities and pronator drift was absent.   Sensory - Light touch symmetrical.    Coordination - No ataxia with FNF or RAM. No tremor.  Gait and Station - deferred.   ASSESSMENT/PLAN [name] is a 42 year old female presenting with acute onset of right facial tingling, right neck pain, N/V and dizziness. She was seen at the [facility] 1/23 with 3 day history of headache with new onset of N/V and dizziness and was diagnosed with bilateral vertebral artery dissections, for which Eliquis was prescribed with outpatient Vascular Surgery follow up planned in  3 months. She left the ED AMA at that time and has not yet filled her Eliquis prescription.   Returned  to the ED 1/24 with acute onset of the above symptoms.    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 ASPECTS 10.    CTA head & neck 1/23  Narrowing of the bilateral V3 segments (moderate on the right and moderate to severe on the left) with eccentric mural hypodensity and luminal irregularity concerning for bilateral vertebral artery dissections/intramural hematomas, new since 03/19/2021 but age-indeterminate. The distal V3 and V4 segments are normal in caliber. MRI no acute infarct 2D Echo pending LDL 102 HgbA1c 5.0 VTE prophylaxis - scd No antithrombotic prior to admission, now  on Eliquis (apixaban) daily for 3 months and then repeat CTA with VVS to decide further regimen Disposition - likely home today  Hypertension Home meds:  metoprolol '100mg'$ , resumed Stable  Long-term BP goal normotensive  Hyperlipidemia Home meds:  none  LDL 102, goal < 70 Add Lipitor '20mg'$   No high intensity statin given LDL not far from goal Continue statin at discharge  Tobacco abuse Current smoker Smoking cessation counseling provided Pt is willing to quit  Other Stroke Risk Factors PSVT  Other Active Problems Hypokalemia Anxiety  Cervical radiculopathy  Anomalous cardiac anatomy s/p surgical repair Leukocytosis WBC 11.4   Hospital day # 0  Pt seen by Neuro NP/APP and later by MD. Note/plan to be edited by MD as needed.    Otelia Santee, DNP, AGACNP-BC [facility] Please use AMION for pager and EPIC for messaging  ATTENDING NOTE: I reviewed above note and agree with the assessment and plan. Pt was seen and examined.   42 year old female with history of cervical radiculopathy, anomalous cardiac anatomy status post surgical repair, smoker, anxiety, PSVT admitted for right facial tingling, nausea vomiting, dizziness and right proptosis for 2 days.  CT no acute abnormality.  CTA head and neck reported bilateral VA dissection.  On my review of the images, there was concern of right VA abnormal signal at V3 segment.  Left VA V3 irregularities.  MRI no acute infarct.  2D echo pending, LDL 102, A1c 5.0.  ESR/CRP negative, creatinine 0.9, WBC 11.4.  On exam, neurology intact, no focal deficit.  Per patient description, patient had for the last 2 days, right ptosis, right miosis, right facial tingling, dizziness with nausea but denies any left-sided numbness, right ataxia, hoarseness, dysphagia.  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  Therefore, right VA dissection seems  more likely the etiology, although patient denies any head trauma, neck trauma or neck manipulation.  Agree with vascular surgery, continue Eliquis for 3 months and then repeat imaging with vascular surgery as outpatient.  Continue low-dose statin.  For detailed assessment and plan, please refer to above/below as I have made changes wherever appropriate.   Neurology will sign off. Please call with questions. Pt will follow up with stroke clinic NP at [facility] in about 4 weeks. Thanks for the consult.   Rosalin Hawking, MD PhD Stroke Neurology 08/04/2022 4:24 PM     To contact Stroke Continuity provider, please refer to [email or web address] After hours, contact General Neurology

## 2022-08-04 NOTE — Progress Notes (Signed)
  Transition of Care [facility]) Screening Note   Patient Details  Name: Vanessa Thomas Date of Birth: 24-Oct-1980   Transition of Care [facility]) CM/SW Contact:    Pollie Friar, RN Phone Number: 08/04/2022, 3:26 PM   Pt is from home with spouse.  Transition of Care Department [facility]) has reviewed patient. We will continue to monitor patient advancement through interdisciplinary progression rounds. If new patient transition needs arise, please place a TOC consult.

## 2022-08-04 NOTE — Discharge Summary (Signed)
Physician Discharge Summary   Patient: Vanessa Thomas MRN: [id number] DOB: February 06, 1981  Admit date:     08/03/2022  Discharge date: 08/04/22  Discharge Physician: Patrecia Pour   PCP: Fredirick Lathe, PA-C   Recommendations at discharge:  Please follow up with vascular surgery for vertebral artery dissection, also incidental aortic root aneurysm, started on eliquis with plans for repeat imaging as an outpatient Follow up with PCP in next 1-2 weeks.   Discharge Diagnoses: Principal Problem:   Vertebral artery dissection (HCC) Active Problems:   Leukocytosis   Essential hypertension   S/P VSD closure   Hypokalemia   IBS (irritable bowel syndrome)   Status post ablation of ventricular arrhythmia  Hospital Course: Vanessa Thomas is a 42 y.o. female with medical history significant of cervical radiculopathy, VSD s/p repair as a child, paroxysmal SVT s/p ablation, IBS, and nephrolitihiasis presented with complaints of headache and dizziness.  Symptoms initially started 4 days ago with complaints of a headache while at her daughter's baby shower.  Normally she can take ibuprofen along with heat/ice with improvement in symptoms, but states that this did not help.  As time went on the headache symptoms seemed to persist and worsen.  She reported having change in sensation over the right side of her face and severe pain on the base of her head to behind her right earand through right side of her scalp.  Yesterday morning she had also noticed that her pupils were different sizes and she was having dizziness.  She had gone to [employer] due to her symptoms and was evaluated with a CT angiogram of the head and neck.  Patient had left prior to the official report coming out which noted bilateral changes concerning for bilateral vertebral artery dissections with/intramural hematoma that were new.  She was called and told of the need to come back to the hospital.  Vascular Surgery spoke with the  EDP at yesterday's visit and documented the following from that conversation: "spoke with the emergency department physician at [place] concerning the patient who had some chronic neck pain and was seen in the emergency department there. 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 I reviewed these images. The right vertebral artery looks fairly normal to me. The left vertebral artery had some narrowing and irregularity but I did not see a clear-cut dissection. Regardless, given her history and the possibility of vertebral artery dissection I recommended 3 months of anticoagulation with a follow-up CT scan in 3 months of the neck which I have arranged. I will see her back in the office at that time. If this looks fairly unremarkable we would likely discontinue her anticoagulation."   ED note from [facility] has been reviewed as well: "progressive worsening headache over the past 3 days. It was not thunderclap or severe on onset. She feels like it has been worsening over the past 3 days and feels a sharp electric type pain going across her right face only to her cheek her lower chin and upper forehead. There is been no associated visual changes, focal weakness, numbness or tingling or loss of sensation. She does note known cervical radiculopathy with some associated mild numbness of the right arm however this is unchanged from her baseline."    In the ED, She was afebrile with blood pressures elevated up to 167/100, and all other vital signs maintained. Labs significant for WBC 11.4 and potassium 3.1.  Neurology and vascular surgery consulted,  recommending anticoagulation and completion of stroke work up which did not reveal an ischemic stroke.  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  CT no acute abnormality.  CTA head and neck reported bilateral VA dissection. Concern of right VA abnormal signal at V3 segment. Left VA V3 irregularities. MRI no acute infarct.  2D echo showed no cardioembolic source, LDL 947, A1c 5.0.  ESR/CRP negative, creatinine 0.9, WBC 11.4.   On exam, neurology intact, no focal deficit. Per patient description, patient had for the last 2 days, right ptosis, right miosis, right facial tingling, dizziness with nausea but denies any left-sided numbness, right ataxia, hoarseness, dysphagia.  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  Therefore, right VA dissection seems more likely the etiology, although patient denies any head trauma, neck trauma or neck manipulation.  Agree with vascular surgery, continue Eliquis for 3 months and then repeat imaging with vascular surgery as outpatient.  Continue low-dose statin.  Consultants: VVS, Neurology Procedures performed: Echo  Disposition: Home Diet recommendation:  Cardiac diet DISCHARGE MEDICATION: Allergies as of 08/04/2022       Reactions   Meperidine Other (See Comments)   Alters mental status, rapid heart rate         Medication List     STOP taking these medications    ondansetron 4 MG disintegrating tablet Commonly known as: ZOFRAN-ODT       TAKE these medications    Apixaban Starter Pack ('10mg'$  and '5mg'$ ) Commonly known as: ELIQUIS STARTER PACK Take as directed on package: start with two-'5mg'$  tablets twice daily for 7 days. On day 8, switch to one-'5mg'$  tablet twice daily. What changed:  how much to take how to take this when to take this additional instructions   atorvastatin 20 MG tablet Commonly known as: LIPITOR Take 1 tablet (20 mg total) by mouth daily. Start taking on: August 05, 2022   metoprolol succinate 100 MG 24 hr tablet Commonly known as: TOPROL-XL Take 1  tablet (100 mg total) by mouth 2 (two) times daily. Take with or immediately following a meal.   oxyCODONE-acetaminophen 10-325 MG tablet Commonly known as: PERCOCET Take 1 tablet by mouth 4 (four) times daily.   prochlorperazine 10 MG tablet Commonly known as: COMPAZINE Take 1 tablet (10 mg total) by mouth every 6 (six) hours as needed for nausea or vomiting.        Follow-up Information     [facility]. Schedule an appointment as soon as possible for a visit in 1 month(s).   Specialty: Neurology Why: stroke clinic Contact information: [address] [place] [phone]        [place], Randa Evens, [place]. Schedule an appointment as soon as possible for a visit in 1 week(s).   Specialty: Physician Assistant Contact information: [place] [address] [phone]                Discharge Exam: Danley Danker Weights   08/03/22 1275  Weight: 92.2 kg  BP (!) 151/103 (BP Location: Right Arm)   Pulse 73   Temp 98.4 F (36.9 C) (Oral)   Resp 15   Ht 6' (1.829 m)   Wt 92.2 kg   SpO2 97%  BMI 27.57 kg/m   Well-appearing in no distress RRR, no MRG or edema Clear, nonlabored No focal deficits  Condition at discharge: stable  The results of significant diagnostics from this hospitalization (including imaging, microbiology, ancillary and laboratory) are listed below for reference.   Imaging Studies: ECHOCARDIOGRAM COMPLETE  Result Date: 08/04/2022    ECHOCARDIOGRAM REPORT   [name] Date of Exam: 08/04/2022 Medical Rec #:  [id number]      Height:       72.0 in Accession #:    [id number]     Weight:       203.3 lb Date of Birth:  Sep 28, 1980      BSA:          2.145 m Patient Age:    17 years       BP:           153/96 mmHg Patient Gender: F              HR:           70 bpm. Exam Location:  Inpatient Procedure: 2D Echo, Cardiac Doppler and Color Doppler Indications:     TIA G45.9  History:        Patient has no prior history of Echocardiogram examinations.                 Arrythmias:Tachycardia and RBBB; Risk Factors:Hypertension and                 Current Smoker.  Sonographer:    Ronny Flurry Referring Phys: [id number] [place] XU IMPRESSIONS  1. Left ventricular ejection fraction, by estimation, is 60 to 65%. The left ventricle has normal function. The left ventricle has no regional wall motion abnormalities. Left ventricular diastolic parameters were normal.  2. Right ventricular systolic function is normal. The right ventricular size is normal.  3. Cannot rule out PFO/ASD.  4. No evidence of mitral valve regurgitation.  5. The aortic valve was not well visualized. Aortic valve regurgitation is not visualized.  6. Aneurysm of the aortic root, measuring 41 mm. Conclusion(s)/Recommendation(s): Cannot rule out PFO/ASD. If suspicion is high for a cardioembolic source for CVA, consider a TEE. FINDINGS  Left Ventricle: Left ventricular ejection fraction, by estimation, is 60 to 65%. The left ventricle has normal function. The left ventricle has no regional wall motion abnormalities. The left ventricular internal cavity size was normal in size. There is  borderline left ventricular hypertrophy. Left ventricular diastolic parameters were normal. Right Ventricle: The right ventricular size is normal. Right ventricular systolic function is normal. Left Atrium: Left atrial size was normal in size. Right Atrium: Right atrial size was normal in size. Pericardium: There is no evidence of pericardial effusion. Mitral Valve: No evidence of mitral valve regurgitation. Tricuspid Valve: Tricuspid valve regurgitation is not demonstrated. Aortic Valve: The aortic valve was not well visualized. Aortic valve regurgitation is not visualized. Aortic valve mean gradient measures 5.0 mmHg. Aortic valve peak gradient measures 8.5 mmHg. Aortic valve area, by VTI measures 3.32 cm. Pulmonic Valve: Pulmonic  valve regurgitation is not visualized. Aorta: The aortic root and ascending aorta are structurally normal, with no evidence of dilitation. There is an aneurysm involving the aortic root measuring 41 mm.  LEFT VENTRICLE PLAX 2D LVIDd:         4.80 cm   Diastology LVIDs:         3.10 cm   LV e' medial:    8.59 cm/s  LV PW:         1.00 cm   LV E/e' medial:  9.8 LV IVS:        1.00 cm   LV e' lateral:   7.62 cm/s LVOT diam:     2.20 cm   LV E/e' lateral: 11.1 LV SV:         93 LV SV Index:   43 LVOT Area:     3.80 cm                           3D Volume EF:                          3D EF:        55 %                          LV EDV:       143 ml                          LV ESV:       64 ml                          LV SV:        79 ml RIGHT VENTRICLE             IVC RV S prime:     11.90 cm/s  IVC diam: 1.80 cm TAPSE (M-mode): 1.7 cm LEFT ATRIUM             Index        RIGHT ATRIUM           Index LA diam:        2.90 cm 1.35 cm/m   RA Area:     15.30 cm LA Vol (A2C):   33.3 ml 15.52 ml/m  RA Volume:   36.30 ml  16.92 ml/m LA Vol (A4C):   27.0 ml 12.59 ml/m LA Biplane Vol: 30.6 ml 14.26 ml/m  AORTIC VALVE AV Area (Vmax):    3.44 cm AV Area (Vmean):   3.21 cm AV Area (VTI):     3.32 cm AV Vmax:           146.00 cm/s AV Vmean:          97.700 cm/s AV VTI:            0.280 m AV Peak Grad:      8.5 mmHg AV Mean Grad:      5.0 mmHg LVOT Vmax:         132.00 cm/s LVOT Vmean:        82.600 cm/s LVOT VTI:          0.244 m LVOT/AV VTI ratio: 0.87  AORTA Ao Root diam: 4.10 cm Ao Asc diam:  3.70 cm MITRAL VALVE MV Area (PHT): 3.48 cm    SHUNTS MV Decel Time: 218 msec    Systemic VTI:  0.24 m MV E velocity: 84.40 cm/s  Systemic Diam: 2.20 cm MV A velocity: 77.60 cm/s MV E/A ratio:  1.09 Mary Scientist, physiological signed by Phineas Inches Signature Date/Time: 08/04/2022/4:54:57 PM    Final    MR BRAIN WO CONTRAST  Result Date: 08/03/2022 CLINICAL DATA:  Headache, neuro deficit EXAM: MRI HEAD WITHOUT CONTRAST TECHNIQUE:  Multiplanar, multiecho pulse sequences of the brain and surrounding structures were obtained without intravenous contrast. COMPARISON:  CT head from the same day. FINDINGS: Brain: No acute infarction, hemorrhage, hydrocephalus, extra-axial collection or mass lesion. Vascular: Better evaluated on same day CTA. Skull and upper cervical spine: Normal marrow signal. Sinuses/Orbits: Largely clear sinuses.  No acute orbital findings. Other: No mastoid effusions. IMPRESSION: No evidence of acute intracranial abnormality. Electronically Signed   By: Margaretha Sheffield M.D.   On: 08/03/2022 14:22   CT HEAD CODE STROKE WO CONTRAST  Result Date: 08/03/2022 CLINICAL DATA:  Code stroke.  Neuro deficit, acute, stroke suspected EXAM: CT HEAD WITHOUT CONTRAST TECHNIQUE: Contiguous axial images were obtained from the base of the skull through the vertex without intravenous contrast. RADIATION DOSE REDUCTION: This exam was performed according to the departmental dose-optimization program which includes automated exposure control, adjustment of the mA and/or kV according to patient size and/or use of iterative reconstruction technique. COMPARISON:  None Available. FINDINGS: Brain: No evidence of acute large vascular territory infarction, hemorrhage, hydrocephalus, extra-axial collection or mass lesion/mass effect. Vascular: No hyperdense vessel identified. Skull: No acute fracture. Sinuses/Orbits: Right maxillary sinus retention cyst. Otherwise, clear sinuses. No acute orbital findings. Other: No mastoid effusions. ASPECTS [facility] Stroke Program Early CT Score) total score (0-10 with 10 being normal): 10. IMPRESSION: 1. No evidence of acute intracranial abnormality. 2. ASPECTS is 10. Electronically Signed   By: Margaretha Sheffield M.D.   On: 08/03/2022 09:59   CT ANGIO HEAD NECK W WO CM  Result Date: 08/02/2022 CLINICAL DATA:  Headache with trigeminal neuralgia like symptoms on the right. Slightly asymmetric pupils. EXAM: CT  ANGIOGRAPHY HEAD AND NECK TECHNIQUE: Multidetector CT imaging of the head and neck was performed using the standard protocol during bolus administration of intravenous contrast. Multiplanar CT image reconstructions and MIPs were obtained to evaluate the vascular anatomy. Carotid stenosis measurements (when applicable) are obtained utilizing NASCET criteria, using the distal internal carotid diameter as the denominator. RADIATION DOSE REDUCTION: This exam was performed according to the departmental dose-optimization program which includes automated exposure control, adjustment of the mA and/or kV according to patient size and/or use of iterative reconstruction technique. CONTRAST:  87m OMNIPAQUE IOHEXOL 350 MG/ML SOLN COMPARISON:  Same-day noncontrast CT head, MRA neck 03/19/2021 FINDINGS: CTA NECK FINDINGS Aortic arch: The imaged aortic arch is normal. The origins of the major branch vessels are patent. The subclavian arteries are patent to the level imaged. Right carotid system: The right common, internal, and external carotid arteries are patent, without hemodynamically significant stenosis or occlusion. There is no dissection or aneurysm. Left carotid system: The left common, internal, and external carotid arteries are patent, without hemodynamically significant stenosis or occlusion. There is no dissection or aneurysm. Vertebral arteries: The right V1 and V2 segments are patent. There is moderate narrowing of the V3 segment with eccentric, crescentic hypodensity along the wall (8-151). The V3/V4 junction is normal in caliber. No raised dissection flap or pseudoaneurysm is seen. The left V1 and V2 segments are patent and normal in caliber. There is moderate to severe narrowing of the V3 segment with eccentric mural hypodensity (8-156, 7-168). The distal V3 segment is normal in caliber. These findings are new since the MRA neck from September 2022. Skeleton: There is degenerative endplate change CH0-Q6without  evidence of high-grade spinal canal or neural foraminal stenosis. There is no acute osseous abnormality or suspicious osseous lesion. There is no visible canal hematoma. Other neck: The soft tissues of  the neck are unremarkable. Upper chest: The imaged lung apices are clear. Review of the MIP images confirms the above findings CTA HEAD FINDINGS Anterior circulation: The intracranial ICAs are patent. The bilateral MCAs are patent, without proximal high-grade stenosis or occlusion. The bilateral ACAs are patent, without proximal high-grade stenosis or occlusion. The anterior communicating artery is normal. There is no aneurysm or AVM. Posterior circulation: The bilateral V4 segments are patent and normal in caliber. The basilar artery is patent. The major cerebellar arteries appear patent. The bilateral PCAs are patent, without proximal high-grade stenosis or occlusion. A diminutive right posterior communicating artery is identified. There is no aneurysm or AVM. Venous sinuses: Patent. Anatomic variants: None. Review of the MIP images confirms the above findings IMPRESSION: 1. 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 The distal V3 and V4 segments are normal in caliber. Consider NIR consult. 2. Otherwise normal vasculature of the head and neck. Electronically Signed   By: Valetta Mole M.D.   On: 08/02/2022 13:11   CT Head Wo Contrast  Result Date: 08/02/2022 CLINICAL DATA:  bad headache, anisocoria EXAM: CT HEAD WITHOUT CONTRAST TECHNIQUE: Contiguous axial images were obtained from the base of the skull through the vertex without intravenous contrast. RADIATION DOSE REDUCTION: This exam was performed according to the departmental dose-optimization program which includes automated  exposure control, adjustment of the mA and/or kV according to patient size and/or use of iterative reconstruction technique. COMPARISON:  06/14/2015 FINDINGS: Brain: No evidence of acute infarction, hemorrhage, hydrocephalus, extra-axial collection or mass lesion/mass effect. Vascular: No hyperdense vessel or unexpected calcification. Skull: Normal. Negative for fracture or focal lesion. Sinuses/Orbits: No acute finding. IMPRESSION: No acute intracranial process. Electronically Signed   By: Sammie Bench M.D.   On: 08/02/2022 10:43    Microbiology: Results for orders placed or performed in visit on 06/28/19  Novel Coronavirus, NAA ([facility])     Status: None   Collection Time: 06/28/19  1:51 PM   Specimen: Nasopharyngeal(NP) swabs in vial transport medium   NASOPHARYNGE  TESTING  Result Value Ref Range Status   SARS-CoV-2, NAA Not Detected Not Detected Final    Comment: This nucleic acid amplification test was developed and its performance characteristics determined by [employer]. Nucleic acid amplification tests include PCR and TMA. This test has not been FDA cleared or approved. This test has been authorized by FDA under an Emergency Use Authorization (EUA). This test is only authorized for the duration of time the declaration that circumstances exist justifying the authorization of the emergency use of in vitro diagnostic tests for detection of SARS-CoV-2 virus and/or diagnosis of COVID-19 infection under section 564(b)(1) of the Act, 21 U.S.C. 448JEH-6(D) (1), unless the authorization is terminated or revoked sooner. When diagnostic testing is negative, the possibility of a false negative result should be considered in the context of a patient's recent exposures and the presence of clinical signs and symptoms consistent with COVID-19. An individual without symptoms of COVID-19 and who is not shedding SARS-CoV-2 virus would  expect to have a negative (not detected) result in  this assay.     Labs: CBC: Recent Labs  Lab 08/02/22 1106 08/03/22 0937 08/03/22 0955  WBC 7.2 11.4*  --   NEUTROABS 6.0 8.0*  --   HGB 15.0 14.7 14.3  HCT 43.9 42.4 42.0  MCV 85.7 86.5  --   PLT 257 262  --    Basic Metabolic Panel: Recent Labs  Lab 08/02/22 1106 08/03/22 0937 08/03/22 0955  NA 138 142 142  K 3.7 3.1* 3.1*  CL 105 106 105  CO2 25 23  --   GLUCOSE 102* 115* 113*  BUN '9 10 10  '$ CREATININE 0.72 1.02* 0.90  CALCIUM 9.3 8.9  --    Liver Function Tests: Recent Labs  Lab 08/03/22 0937  AST 21  ALT 12  ALKPHOS 56  BILITOT 0.7  PROT 6.3*  ALBUMIN 3.7   CBG: Recent Labs  Lab 08/03/22 1003 08/03/22 2056 08/04/22 0602  GLUCAP 112* 84 89    Discharge time spent: greater than 30 minutes.  Signed: Patrecia Pour, MD [facility] 08/04/2022

## 2022-08-05 ENCOUNTER — Other Ambulatory Visit (HOSPITAL_COMMUNITY): Payer: Self-pay

## 2022-08-08 ENCOUNTER — Telehealth: Payer: Self-pay

## 2022-08-08 NOTE — Telephone Encounter (Signed)
Transition Care Management Follow-up Telephone Call Date of discharge and from where:   Vanessa Thomas 08-04-22 Dx: vertebral artery dissection    How have you been since you were released from the hospital? Doing good - little headache but that is to be expected  Any questions or concerns? No  Items Reviewed: Did the pt receive and understand the discharge instructions provided? No  Medications obtained and verified? Yes  Other? No  Any new allergies since your discharge? No  Dietary orders reviewed? Yes Do you have support at home? Yes   Home Care and Equipment/Supplies: Were home health services ordered? no If so, what is the name of the agency? na  Has the agency set up a time to come to the patient's home? not applicable Were any new equipment or medical supplies ordered?  No What is the name of the medical supply agency? na Were you able to get the supplies/equipment? not applicable Do you have any questions related to the use of the equipment or supplies? No  Functional Questionnaire: (I = Independent and D = Dependent) ADLs: I  Bathing/Dressing- I  Meal Prep- I  Eating- I  Maintaining continence- I  Transferring/Ambulation- I  Managing Meds- I  Follow up appointments reviewed:  PCP Hospital f/u appt confirmed? Yes  Scheduled to see Alyssa Allwardt PA-C on 08-12-22 @ 1030am. [facility] f/u appt confirmed? No Pt plans to call today to schedule fu appt with Vascular . Are transportation arrangements needed? No  If their condition worsens, is the pt aware to call PCP or go to the Emergency Dept.? Yes Was the patient provided with contact information for the PCP's office or ED? Yes Was to pt encouraged to call back with questions or concerns? Yes    Juanda Crumble LPN [place] Direct Dial [phone]

## 2022-08-08 NOTE — Telephone Encounter (Signed)
Transition Care Management Unsuccessful Follow-up Telephone Call  Date of discharge and from where: [place] 08-04-22 Dx: vertebral artery dissection    Attempts:  1st Attempt  Reason for unsuccessful TCM follow-up call:  Left voice message   Juanda Crumble LPN [place] Direct Dial [phone]

## 2022-08-09 ENCOUNTER — Other Ambulatory Visit (HOSPITAL_COMMUNITY): Payer: Self-pay

## 2022-08-12 ENCOUNTER — Ambulatory Visit (INDEPENDENT_AMBULATORY_CARE_PROVIDER_SITE_OTHER): Payer: BC Managed Care – PPO | Admitting: Physician Assistant

## 2022-08-12 ENCOUNTER — Encounter: Payer: Self-pay | Admitting: Physician Assistant

## 2022-08-12 VITALS — BP 130/86 | HR 76 | Temp 97.8°F | Ht 72.0 in | Wt 206.0 lb

## 2022-08-12 DIAGNOSIS — D72829 Elevated white blood cell count, unspecified: Secondary | ICD-10-CM | POA: Diagnosis not present

## 2022-08-12 DIAGNOSIS — I7121 Aneurysm of the ascending aorta, without rupture: Secondary | ICD-10-CM | POA: Diagnosis not present

## 2022-08-12 DIAGNOSIS — I77810 Thoracic aortic ectasia: Secondary | ICD-10-CM | POA: Insufficient documentation

## 2022-08-12 DIAGNOSIS — E7800 Pure hypercholesterolemia, unspecified: Secondary | ICD-10-CM | POA: Diagnosis not present

## 2022-08-12 DIAGNOSIS — N200 Calculus of kidney: Secondary | ICD-10-CM | POA: Insufficient documentation

## 2022-08-12 DIAGNOSIS — E876 Hypokalemia: Secondary | ICD-10-CM | POA: Diagnosis not present

## 2022-08-12 DIAGNOSIS — I7774 Dissection of vertebral artery: Secondary | ICD-10-CM

## 2022-08-12 DIAGNOSIS — F4322 Adjustment disorder with anxiety: Secondary | ICD-10-CM

## 2022-08-12 HISTORY — DX: Pure hypercholesterolemia, unspecified: E78.00

## 2022-08-12 LAB — CBC WITH DIFFERENTIAL/PLATELET
Basophils Absolute: 0.1 10*3/uL (ref 0.0–0.1)
Basophils Relative: 0.7 % (ref 0.0–3.0)
Eosinophils Absolute: 0.1 10*3/uL (ref 0.0–0.7)
Eosinophils Relative: 1.3 % (ref 0.0–5.0)
HCT: 44.6 % (ref 36.0–46.0)
Hemoglobin: 15 g/dL (ref 12.0–15.0)
Lymphocytes Relative: 20.4 % (ref 12.0–46.0)
Lymphs Abs: 1.6 10*3/uL (ref 0.7–4.0)
MCHC: 33.7 g/dL (ref 30.0–36.0)
MCV: 88.4 fl (ref 78.0–100.0)
Monocytes Absolute: 0.7 10*3/uL (ref 0.1–1.0)
Monocytes Relative: 8.9 % (ref 3.0–12.0)
Neutro Abs: 5.3 10*3/uL (ref 1.4–7.7)
Neutrophils Relative %: 68.7 % (ref 43.0–77.0)
Platelets: 292 10*3/uL (ref 150.0–400.0)
RBC: 5.04 Mil/uL (ref 3.87–5.11)
RDW: 13.2 % (ref 11.5–15.5)
WBC: 7.7 10*3/uL (ref 4.0–10.5)

## 2022-08-12 LAB — BASIC METABOLIC PANEL
BUN: 12 mg/dL (ref 6–23)
CO2: 29 mEq/L (ref 19–32)
Calcium: 9.5 mg/dL (ref 8.4–10.5)
Chloride: 105 mEq/L (ref 96–112)
Creatinine, Ser: 0.9 mg/dL (ref 0.40–1.20)
GFR: 79.8 mL/min (ref >60.00)
Glucose, Bld: 89 mg/dL (ref 70–99)
Potassium: 4.6 mEq/L (ref 3.5–5.1)
Sodium: 141 mEq/L (ref 135–145)

## 2022-08-12 MED ORDER — TRAZODONE HCL 50 MG PO TABS: 25.0000 mg | ORAL_TABLET | Freq: Every evening | ORAL | 0 refills | Status: DC | PRN
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
## 2022-08-13 ENCOUNTER — Other Ambulatory Visit (HOSPITAL_COMMUNITY): Payer: Self-pay

## 2022-08-22 ENCOUNTER — Other Ambulatory Visit: Payer: Self-pay

## 2022-08-22 DIAGNOSIS — I7774 Dissection of vertebral artery: Secondary | ICD-10-CM

## 2022-08-30 DIAGNOSIS — Z5181 Encounter for therapeutic drug level monitoring: Secondary | ICD-10-CM | POA: Diagnosis not present

## 2022-08-30 DIAGNOSIS — M5030 Other cervical disc degeneration, unspecified cervical region: Secondary | ICD-10-CM | POA: Diagnosis not present

## 2022-08-30 DIAGNOSIS — M5412 Radiculopathy, cervical region: Secondary | ICD-10-CM | POA: Diagnosis not present

## 2022-09-05 ENCOUNTER — Ambulatory Visit: Payer: BC Managed Care – PPO

## 2022-09-06 NOTE — Patient Instructions (Signed)
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 Continue Eliquis (apixaban) daily and atorvastaitn '20mg'$  daily for secondary stroke prevention.  Discussed secondary stroke prevention measures and importance of close PCP follow up for aggressive stroke risk factor management. I have gone over the pathophysiology of stroke, warning signs and symptoms, risk factors and their management in some detail with instructions to go to the closest emergency room for symptoms of concern. HTN: BP goal <130/90.  Stable on metoprolol '100mg'$  BID per PCP.  HLD: LDL goal <70. Recent LDL 102. Continue atorvastatin '20mg'$  daily per PCP.  DMII: A1c goal<7.0. Recent A1c 5.0. Continue healthy  Left vertebral artery narrowing: continue close follow up with vascular surgery.  Incidental finding of aortic root aneurysm: Continue Eliquis and repeat imaging per PCP/vascular surgery Cervical radiculopathy: continue close follow up with neurosurgery Headaches: tizanidine 2-'4mg'$  twice daily as needed.   Please make sure you are staying well hydrated. I recommend 50-60 ounces daily. Well balanced diet and regular exercise encouraged. Consistent sleep schedule with 6-8 hours recommended.   Please continue follow up with care team as directed.   Follow up with me in 3 months   You may receive a survey regarding today's visit. I encourage you to leave honest feed back as I do use this information to improve patient care. Thank you for seeing me today!
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
## 2022-09-07 ENCOUNTER — Ambulatory Visit: Payer: BC Managed Care – PPO | Admitting: Family Medicine

## 2022-09-07 ENCOUNTER — Encounter: Payer: Self-pay | Admitting: Family Medicine

## 2022-09-07 VITALS — BP 137/94 | HR 96 | Ht 72.0 in | Wt 207.0 lb

## 2022-09-07 DIAGNOSIS — E7800 Pure hypercholesterolemia, unspecified: Secondary | ICD-10-CM

## 2022-09-07 DIAGNOSIS — M5412 Radiculopathy, cervical region: Secondary | ICD-10-CM

## 2022-09-07 DIAGNOSIS — I7121 Aneurysm of the ascending aorta, without rupture: Secondary | ICD-10-CM

## 2022-09-07 DIAGNOSIS — I7774 Dissection of vertebral artery: Secondary | ICD-10-CM

## 2022-09-07 DIAGNOSIS — I10 Essential (primary) hypertension: Secondary | ICD-10-CM | POA: Diagnosis not present

## 2022-09-07 MED ORDER — TIZANIDINE HCL 2 MG PO TABS: 2.0000 mg | ORAL_TABLET | Freq: Two times a day (BID) | ORAL | 0 refills | Status: DC

## 2022-09-07 NOTE — Addendum Note (Signed)
Addended by: Debbora Presto L on: 09/07/2022 09:53 AM   Modules accepted: Level of Service

## 2022-09-14 ENCOUNTER — Other Ambulatory Visit: Payer: Self-pay | Admitting: Family Medicine

## 2022-09-29 ENCOUNTER — Other Ambulatory Visit: Payer: Self-pay | Admitting: *Deleted

## 2022-09-29 MED ORDER — TIZANIDINE HCL 2 MG PO TABS: 2.0000 mg | ORAL_TABLET | Freq: Two times a day (BID) | ORAL | 0 refills | Status: DC

## 2022-09-29 NOTE — Telephone Encounter (Signed)
Pt last seen on 09/07/22 Follow up scheduled on 11/08/22

## 2022-10-03 ENCOUNTER — Ambulatory Visit: Payer: BC Managed Care – PPO

## 2022-10-14 ENCOUNTER — Other Ambulatory Visit (HOSPITAL_COMMUNITY): Payer: Self-pay

## 2022-10-17 ENCOUNTER — Ambulatory Visit (HOSPITAL_COMMUNITY)
Admission: RE | Admit: 2022-10-17 | Discharge: 2022-10-17 | Disposition: A | Discharge status: Home / Self Care | Payer: BC Managed Care – PPO | Source: Ambulatory Visit | Attending: Vascular Surgery | Admitting: Vascular Surgery

## 2022-10-17 DIAGNOSIS — I7774 Dissection of vertebral artery: Secondary | ICD-10-CM | POA: Insufficient documentation

## 2022-10-17 DIAGNOSIS — I6523 Occlusion and stenosis of bilateral carotid arteries: Secondary | ICD-10-CM | POA: Diagnosis not present

## 2022-10-17 MED ORDER — IOHEXOL 350 MG/ML SOLN
75.0000 mL | Freq: Once | INTRAVENOUS | Status: AC | PRN
  Administered 2022-10-17: 75 mL via INTRAVENOUS

## 2022-10-26 ENCOUNTER — Other Ambulatory Visit: Payer: Self-pay

## 2022-10-26 DIAGNOSIS — I7774 Dissection of vertebral artery: Secondary | ICD-10-CM

## 2022-11-03 ENCOUNTER — Encounter: Payer: BC Managed Care – PPO | Admitting: Vascular Surgery

## 2022-11-08 ENCOUNTER — Encounter: Payer: Self-pay | Admitting: Family Medicine

## 2022-11-08 ENCOUNTER — Ambulatory Visit: Payer: BC Managed Care – PPO | Admitting: Family Medicine
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
## 2022-11-11 ENCOUNTER — Other Ambulatory Visit (HOSPITAL_COMMUNITY): Payer: Self-pay

## 2022-11-15 ENCOUNTER — Ambulatory Visit (HOSPITAL_COMMUNITY): Payer: BC Managed Care – PPO

## 2022-11-16 ENCOUNTER — Other Ambulatory Visit (HOSPITAL_COMMUNITY): Payer: Self-pay

## 2022-11-17 ENCOUNTER — Encounter: Payer: BC Managed Care – PPO | Admitting: Vascular Surgery

## 2023-01-19 DIAGNOSIS — Z79899 Other long term (current) drug therapy: Secondary | ICD-10-CM | POA: Diagnosis not present

## 2023-01-19 DIAGNOSIS — M5030 Other cervical disc degeneration, unspecified cervical region: Secondary | ICD-10-CM | POA: Diagnosis not present

## 2023-01-19 DIAGNOSIS — Z5181 Encounter for therapeutic drug level monitoring: Secondary | ICD-10-CM | POA: Diagnosis not present

## 2023-01-19 DIAGNOSIS — M5412 Radiculopathy, cervical region: Secondary | ICD-10-CM | POA: Diagnosis not present

## 2023-02-28 ENCOUNTER — Telehealth: Payer: BC Managed Care – PPO | Admitting: Family

## 2023-02-28 ENCOUNTER — Encounter: Payer: Self-pay | Admitting: Physician Assistant

## 2023-02-28 ENCOUNTER — Encounter: Payer: Self-pay | Admitting: Family

## 2023-02-28 VITALS — Ht 72.0 in | Wt 207.1 lb

## 2023-02-28 DIAGNOSIS — R5081 Fever presenting with conditions classified elsewhere: Secondary | ICD-10-CM

## 2023-02-28 DIAGNOSIS — R051 Acute cough: Secondary | ICD-10-CM | POA: Diagnosis not present

## 2023-02-28 DIAGNOSIS — U071 COVID-19: Secondary | ICD-10-CM

## 2023-02-28 MED ORDER — PROMETHAZINE-DM 6.25-15 MG/5ML PO SYRP: 5.0000 mL | ORAL_SOLUTION | Freq: Four times a day (QID) | ORAL | 0 refills | Status: DC | PRN

## 2023-02-28 MED ORDER — MOLNUPIRAVIR EUA 200MG CAPSULE: 4.0000 | ORAL_CAPSULE | Freq: Two times a day (BID) | ORAL | 0 refills | Status: AC
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
## 2023-03-01 DIAGNOSIS — M5412 Radiculopathy, cervical region: Secondary | ICD-10-CM | POA: Diagnosis not present

## 2023-03-17 DIAGNOSIS — M5412 Radiculopathy, cervical region: Secondary | ICD-10-CM | POA: Diagnosis not present

## 2023-03-24 DIAGNOSIS — M542 Cervicalgia: Secondary | ICD-10-CM | POA: Diagnosis not present

## 2023-03-24 DIAGNOSIS — M5412 Radiculopathy, cervical region: Secondary | ICD-10-CM | POA: Diagnosis not present

## 2023-03-27 ENCOUNTER — Other Ambulatory Visit: Payer: Self-pay | Admitting: Physician Assistant

## 2023-03-27 DIAGNOSIS — Z1231 Encounter for screening mammogram for malignant neoplasm of breast: Secondary | ICD-10-CM

## 2023-03-30 ENCOUNTER — Telehealth: Payer: Self-pay

## 2023-03-30 NOTE — Telephone Encounter (Signed)
Marland Kitchen...  Pre-operative Risk Assessment    Patient Name: Vanessa Thomas  DOB: 04-27-1981 MRN: [id number]    NEW PATIENT APPT 04/06/23  Request for Surgical Clearance    Procedure:   TOTAL DISC REPLACEMENT  Date of Surgery: TBD                                 Surgeon:  DR Venita Lick Surgeon's Group or Practice Name:  [facility] Phone number:  [phone] Fax number:  [phone]   Type of Clearance Requested:   - Medical  - Pharmacy:  Hold Apixaban (Eliquis)     Type of Anesthesia:  Not Indicated   Additional requests/questions:    Jola Babinski   03/30/2023, 2:06 PM

## 2023-03-31 NOTE — Telephone Encounter (Signed)
Name: Vanessa Thomas  DOB: [birth year]  MRN: [id number]  Primary Cardiologist: None  Chart reviewed as part of pre-operative protocol coverage. Because of Vanessa Thomas's past medical history and time since last visit, she will require a follow-up in-office visit in order to better assess preoperative cardiovascular risk.  Patient has an office visit scheduled with Dr. Dulce Sellar on 04/06/2023. Appointment notes have been updated to reflect need for pre-op evaluation.   Pre-op covering staff:  - Please contact requesting surgeon's office via preferred method (i.e, phone, fax) to inform them of need for appointment prior to surgery.  Eliquis prescribed by a noncardiology provider (neurology vs. vascular surgery?) therefore recommendations for holding deferred to prescribing provider.    Carlos Levering, NP  03/31/2023, 8:47 AM
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
## 2023-04-06 ENCOUNTER — Ambulatory Visit: Payer: BC Managed Care – PPO | Attending: Cardiology | Admitting: Cardiology

## 2023-04-06 ENCOUNTER — Encounter: Payer: Self-pay | Admitting: Cardiology

## 2023-04-06 VITALS — BP 128/87 | HR 81 | Ht 72.0 in | Wt 211.0 lb

## 2023-04-06 DIAGNOSIS — I4510 Unspecified right bundle-branch block: Secondary | ICD-10-CM

## 2023-04-06 DIAGNOSIS — Z8774 Personal history of (corrected) congenital malformations of heart and circulatory system: Secondary | ICD-10-CM | POA: Diagnosis not present

## 2023-04-06 DIAGNOSIS — Z01818 Encounter for other preprocedural examination: Secondary | ICD-10-CM | POA: Diagnosis not present

## 2023-04-06 DIAGNOSIS — I471 Supraventricular tachycardia, unspecified: Secondary | ICD-10-CM

## 2023-04-06 DIAGNOSIS — I7789 Other specified disorders of arteries and arterioles: Secondary | ICD-10-CM

## 2023-04-06 NOTE — Patient Instructions (Signed)
Medication Instructions:  Your physician recommends that you continue on your current medications as directed. Please refer to the Current Medication list given to you today.  *If you need a refill on your cardiac medications before your next appointment, please call your pharmacy*   Lab Work: None If you have labs (blood work) drawn today and your tests are completely normal, you will receive your results only by: MyChart Message (if you have MyChart) OR A paper copy in the mail If you have any lab test that is abnormal or we need to change your treatment, we will call you to review the results.   Testing/Procedures: None   Follow-Up: At [facility], you and your health needs are our priority.  As part of our continuing mission to provide you with exceptional heart care, we have created designated Provider Care Teams.  These Care Teams include your primary Cardiologist (physician) and Advanced Practice Providers (APPs -  Physician Assistants and Nurse Practitioners) who all work together to provide you with the care you need, when you need it.  We recommend signing up for the patient portal called "MyChart".  Sign up information is provided on this After Visit Summary.  MyChart is used to connect with patients for Virtual Visits (Telemedicine).  Patients are able to view lab/test results, encounter notes, upcoming appointments, etc.  Non-urgent messages can be sent to your provider as well.   To learn more about what you can do with MyChart, go to [email or web address].    Your next appointment:   1 year(s)  Provider:   Brian Munley, MD    Other Instructions None  

## 2023-04-07 IMAGING — MR MR MRA NECK WO/W CM
4 series · 36 of 48 positions shown · IV contrast (multihance)
Comparison: None available.

CLINICAL DATA: Initial evaluation for cervical radiculopathy.

EXAM:
MRA NECK WITHOUT AND WITH CONTRAST
TECHNIQUE: Multiplanar and multiecho pulse sequences of the neck were obtained
without and with intravenous contrast. Angiographic images of the
neck were obtained using MRA technique without and with intravenous
contrast.
CONTRAST:  17mL MULTIHANCE GADOBENATE DIMEGLUMINE 529 MG/ML IV SOLN

[Series 4: fl_tof_[id number] · axial · 3.0mm · 0.39mm/px · z∈[+4,+102]mm · 9 of 50 slices shown]
[im 1/50]
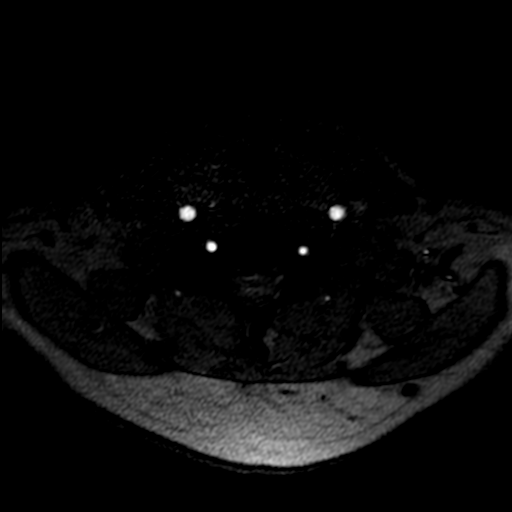
[im 7/50]
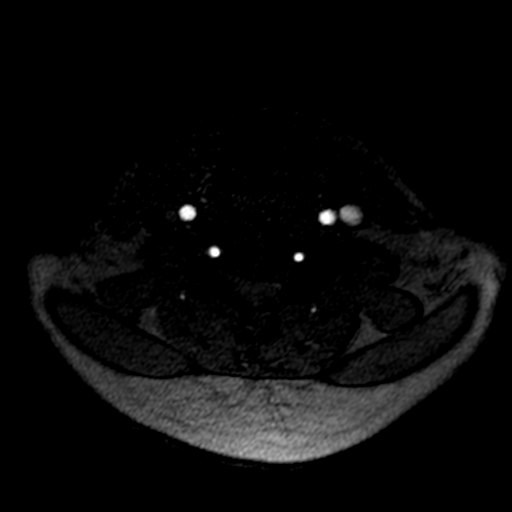
[im 13/50]
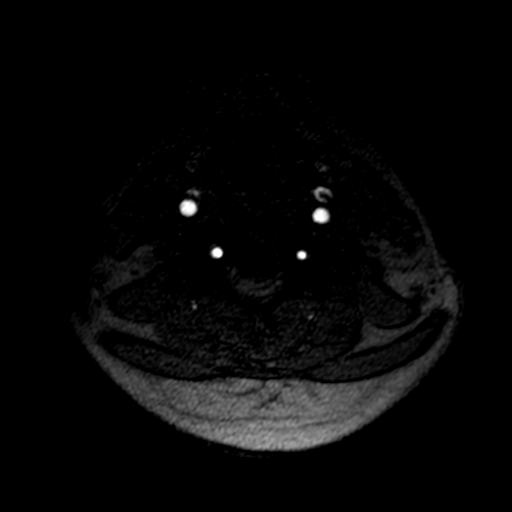
[im 19/50]
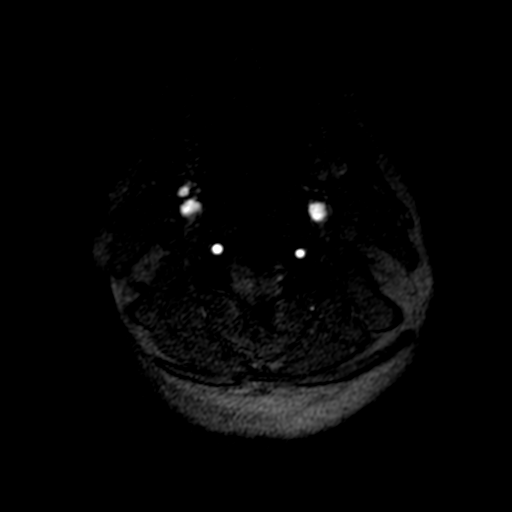
[im 25/50]
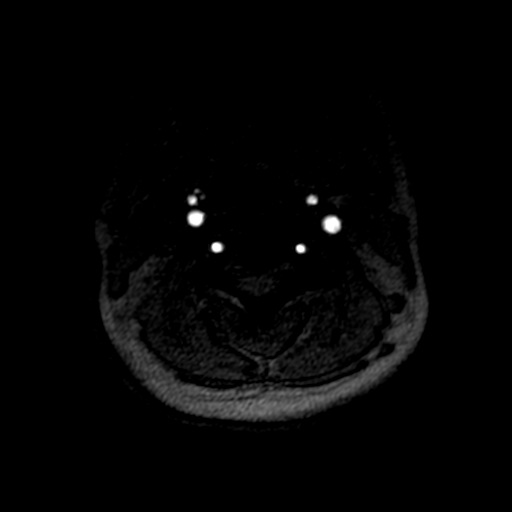
[im 31/50]
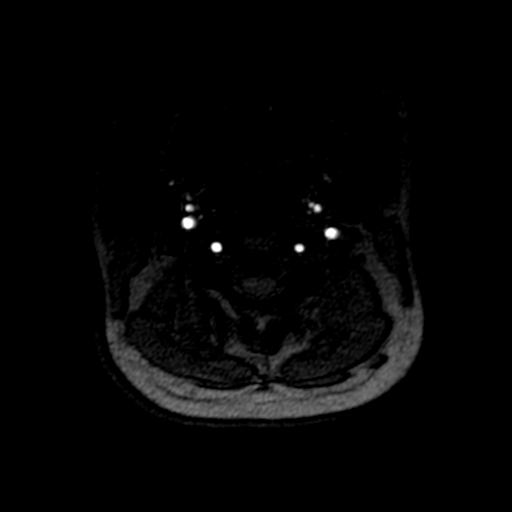
[im 37/50]
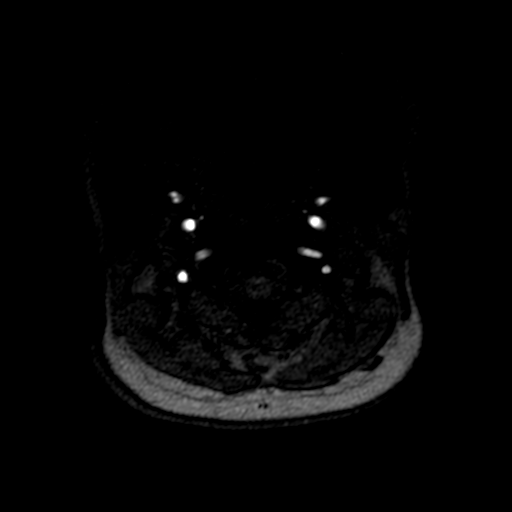
[im 43/50]
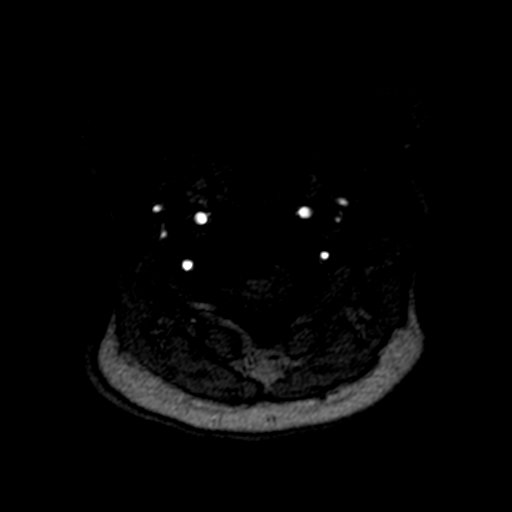
[im 50/50]
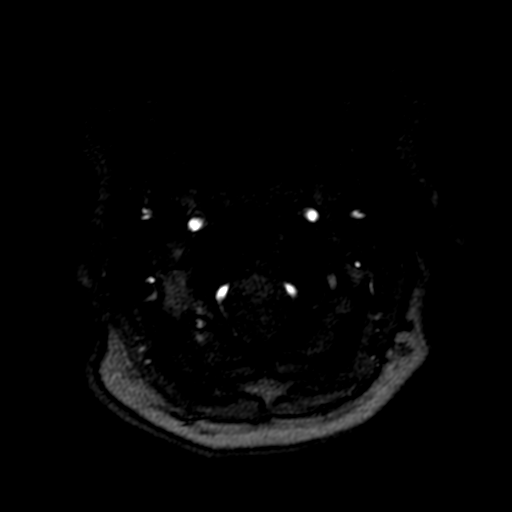

[Series 8: (id)_tt=1.0s · coronal · 0.8mm · 0.78mm/px · 9 of 73 slices shown (1 of 2)]
[im 1/73]
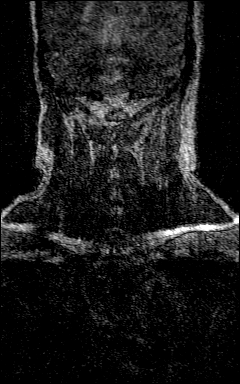
[im 13/73]
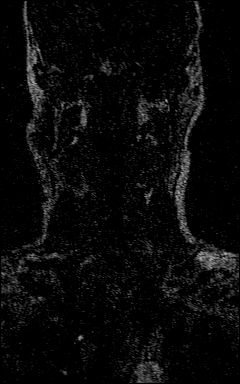
[im 25/73]
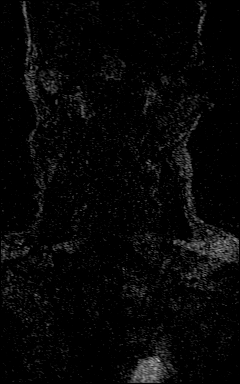
[im 31/73]
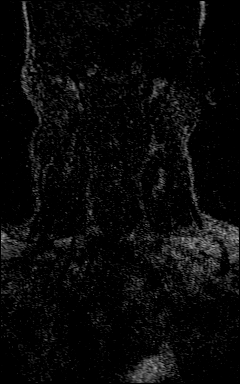
[im 37/73]
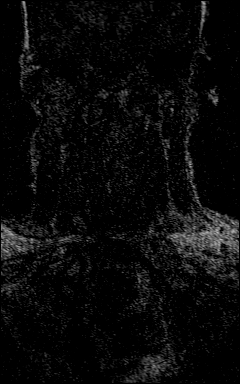
[im 43/73]
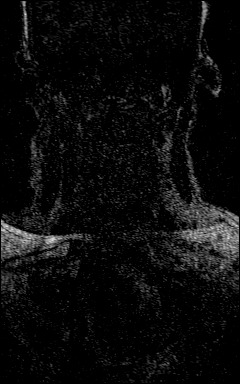
[im 49/73]
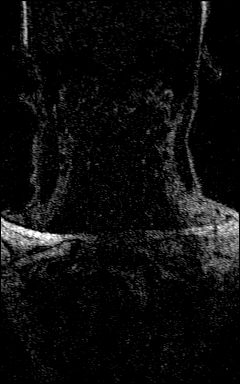
[im 61/73]
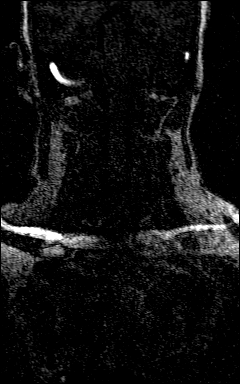
[im 73/73]
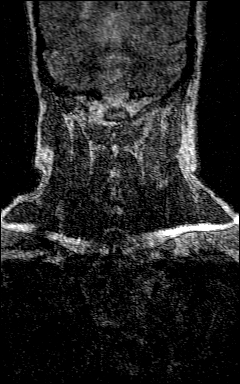

[Series 10: (id)_tt=1.0s · coronal · 0.8mm · 0.78mm/px · 9 of 73 slices shown (2 of 2)]
[im 1/73]
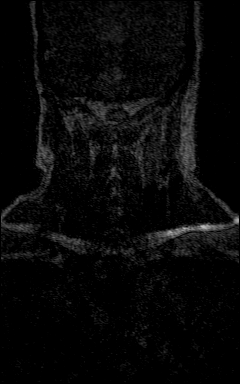
[im 13/73]
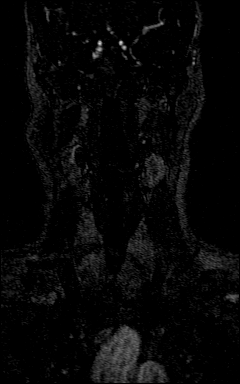
[im 25/73]
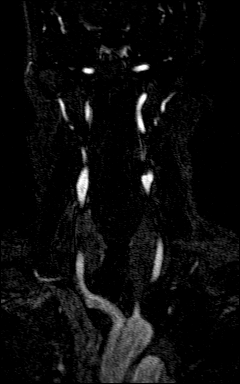
[im 31/73]
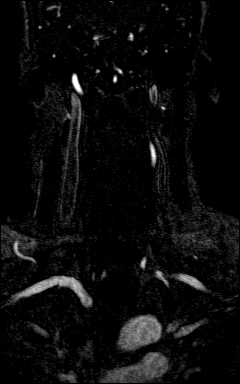
[im 37/73]
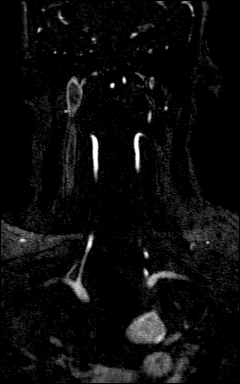
[im 43/73]
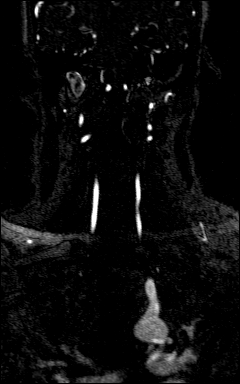
[im 49/73]
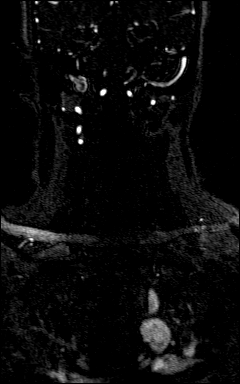
[im 61/73]
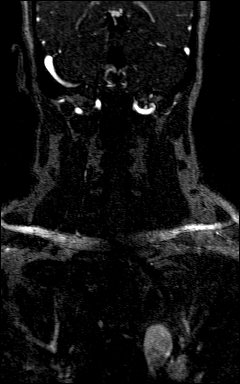
[im 73/73]
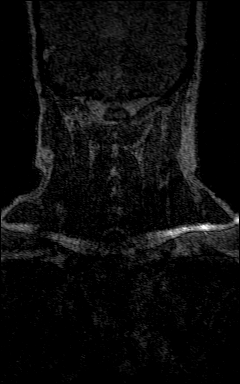

[Series 11: (id)_tt=1.0s_sub · coronal · 0.8mm · 0.78mm/px · 9 of 75 slices shown]
[im 1/75]
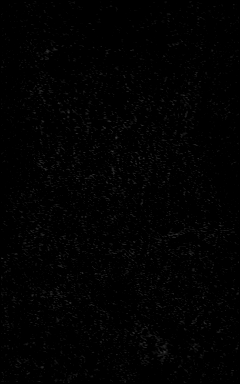
[im 13/75]
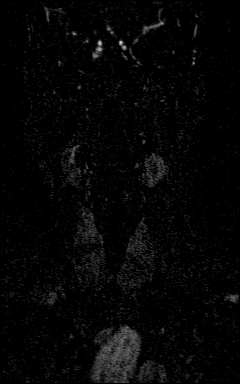
[im 25/75]
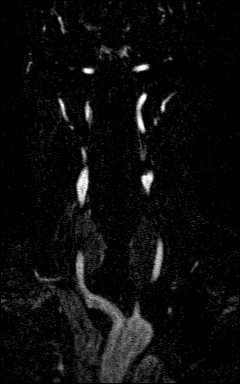
[im 31/75]
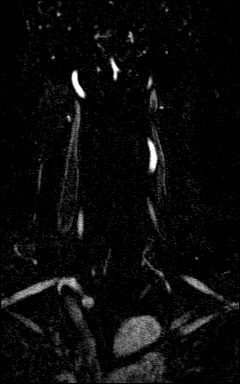
[im 38/75]
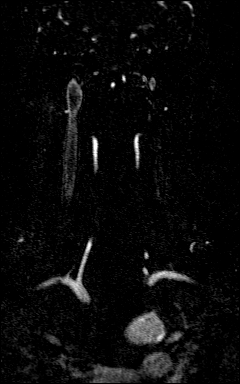
[im 44/75]
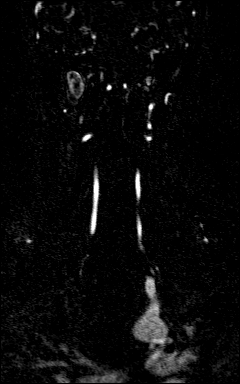
[im 50/75]
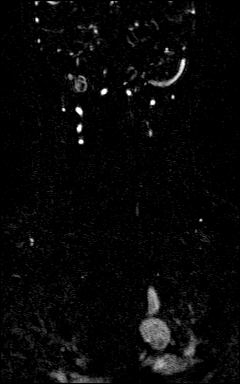
[im 62/75]
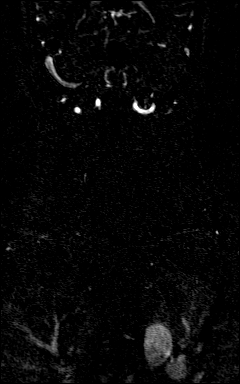
[im 75/75]
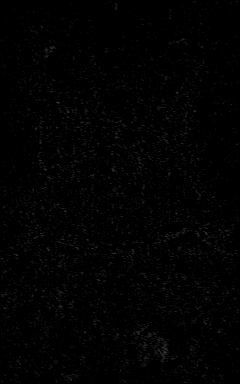

[36 of 48 positions shown; findings below may reference images not displayed]

FINDINGS: AORTIC ARCH: Patent antegrade flow seen within both carotid artery
systems and vertebral arteries on time-of-flight sequence.

Visualized aortic arch normal in caliber with normal 3 vessel
morphology. No stenosis or other abnormality seen about the origin
the great vessels.

RIGHT CAROTID SYSTEM: Right common and internal carotid arteries
patent without stenosis, evidence for dissection or occlusion. No
significant atheromatous narrowing or irregularity about the right
carotid bulb.

LEFT CAROTID SYSTEM: Left common and internal carotid arteries
patent without stenosis, evidence for dissection, or occlusion. No
significant atheromatous narrowing or irregularity about the left
carotid bulb.

VERTEBRAL ARTERIES: Both vertebral arteries arise from the
subclavian arteries. No proximal subclavian artery stenosis. Right
vertebral artery slightly dominant. Vertebral arteries widely patent
without stenosis, evidence for dissection or occlusion.
IMPRESSION: Normal MRA of the neck.

## 2023-04-10 ENCOUNTER — Telehealth: Payer: Self-pay | Admitting: *Deleted

## 2023-04-10 NOTE — Telephone Encounter (Signed)
   Faxed confirmed received from [phone]

## 2023-04-11 ENCOUNTER — Ambulatory Visit: Payer: BC Managed Care – PPO | Admitting: Physician Assistant

## 2023-04-11 VITALS — BP 126/72 | HR 86 | Temp 98.0°F | Ht 72.0 in | Wt 211.6 lb

## 2023-04-11 DIAGNOSIS — L739 Follicular disorder, unspecified: Secondary | ICD-10-CM

## 2023-04-11 DIAGNOSIS — B3731 Acute candidiasis of vulva and vagina: Secondary | ICD-10-CM

## 2023-04-11 DIAGNOSIS — Z1211 Encounter for screening for malignant neoplasm of colon: Secondary | ICD-10-CM

## 2023-04-11 DIAGNOSIS — Z23 Encounter for immunization: Secondary | ICD-10-CM | POA: Diagnosis not present

## 2023-04-11 DIAGNOSIS — M5412 Radiculopathy, cervical region: Secondary | ICD-10-CM | POA: Diagnosis not present

## 2023-04-11 DIAGNOSIS — Z01818 Encounter for other preprocedural examination: Secondary | ICD-10-CM

## 2023-04-11 MED ORDER — DOXYCYCLINE HYCLATE 100 MG PO TABS: 100.0000 mg | ORAL_TABLET | Freq: Two times a day (BID) | ORAL | 0 refills | Status: AC

## 2023-04-11 MED ORDER — FLUCONAZOLE 150 MG PO TABS: 150.0000 mg | ORAL_TABLET | Freq: Every day | ORAL | 0 refills | Status: DC
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
## 2023-04-12 ENCOUNTER — Ambulatory Visit
Admission: RE | Admit: 2023-04-12 | Discharge: 2023-04-12 | Disposition: A | Discharge status: Home / Self Care | Payer: BC Managed Care – PPO | Source: Ambulatory Visit

## 2023-04-12 DIAGNOSIS — Z1231 Encounter for screening mammogram for malignant neoplasm of breast: Secondary | ICD-10-CM

## 2023-04-13 ENCOUNTER — Telehealth: Payer: Self-pay | Admitting: Physician Assistant

## 2023-04-13 NOTE — Telephone Encounter (Signed)
Pt called returning [place] call. Please call pt back

## 2023-04-14 ENCOUNTER — Other Ambulatory Visit: Payer: Self-pay | Admitting: Physician Assistant

## 2023-04-14 DIAGNOSIS — R928 Other abnormal and inconclusive findings on diagnostic imaging of breast: Secondary | ICD-10-CM

## 2023-04-14 NOTE — Telephone Encounter (Signed)
LVM for patient; see lab note

## 2023-04-20 NOTE — Telephone Encounter (Signed)
Requesting office sent duplicate checking on status of clearance. I d/w the pre op APP Bernadene Person, NP, who reviewed MD's notes who has cleared the pt.   Per Bernadene Person, NP: looks like he said she is "optimized" aka cleared.   I will fax Dr. Hulen Shouts notes to the surgeon's office.

## 2023-04-26 ENCOUNTER — Ambulatory Visit
Admission: RE | Admit: 2023-04-26 | Discharge: 2023-04-26 | Disposition: A | Discharge status: Home / Self Care | Payer: BC Managed Care – PPO | Source: Ambulatory Visit | Attending: Physician Assistant | Admitting: Physician Assistant

## 2023-04-26 ENCOUNTER — Other Ambulatory Visit: Payer: Self-pay | Admitting: Physician Assistant

## 2023-04-26 DIAGNOSIS — R928 Other abnormal and inconclusive findings on diagnostic imaging of breast: Secondary | ICD-10-CM

## 2023-04-26 DIAGNOSIS — N6002 Solitary cyst of left breast: Secondary | ICD-10-CM

## 2023-04-26 DIAGNOSIS — N6321 Unspecified lump in the left breast, upper outer quadrant: Secondary | ICD-10-CM | POA: Diagnosis not present

## 2023-04-28 ENCOUNTER — Encounter: Payer: Self-pay | Admitting: Physician Assistant

## 2023-05-02 ENCOUNTER — Ambulatory Visit
Admission: RE | Admit: 2023-05-02 | Discharge: 2023-05-02 | Disposition: A | Discharge status: Home / Self Care | Payer: BC Managed Care – PPO | Source: Ambulatory Visit | Attending: Physician Assistant | Admitting: Physician Assistant

## 2023-05-02 DIAGNOSIS — N6002 Solitary cyst of left breast: Secondary | ICD-10-CM

## 2023-05-19 ENCOUNTER — Ambulatory Visit (HOSPITAL_COMMUNITY): Payer: Self-pay | Admitting: Orthopedic Surgery

## 2023-05-19 NOTE — Progress Notes (Signed)
Surgical Instructions   Your procedure is scheduled on Thursday, May 25, 2023. Report to [facility] Main Entrance "A" at 5:30 A.M., then check in with the Admitting office. Any questions or running late day of surgery: call [phone]  Questions prior to your surgery date: call [phone], Monday-Friday, 8am-4pm. If you experience any cold or flu symptoms such as cough, fever, chills, shortness of breath, etc. between now and your scheduled surgery, please notify us at the above number.     Remember:  Do not eat after midnight the night before your surgery   You may drink clear liquids until 4:30 the morning of your surgery.   Clear liquids allowed are: Water, Non-Citrus Juices (without pulp), Carbonated Beverages, Clear Tea (no milk, honey, etc.), Black Coffee Only (NO MILK, CREAM OR POWDERED CREAMER of any kind), and Gatorade.    Take these medicines the morning of surgery with A SIP OF WATER  atorvastatin (LIPITOR)    May take these medicines IF NEEDED: oxyCODONE-acetaminophen (PERCOCET)    One week prior to surgery, STOP taking any Aspirin (unless otherwise instructed by your surgeon) Aleve, Naproxen, Ibuprofen, Motrin, Advil, Goody's, BC's, all herbal medications, fish oil, and non-prescription vitamins.                     Do NOT Smoke (Tobacco/Vaping) for 24 hours prior to your procedure.  If you use a CPAP at night, you may bring your mask/headgear for your overnight stay.   You will be asked to remove any contacts, glasses, piercing's, hearing aid's, dentures/partials prior to surgery. Please bring cases for these items if needed.    Patients discharged the day of surgery will not be allowed to drive home, and someone needs to stay with them for 24 hours.  SURGICAL WAITING ROOM VISITATION Patients may have no more than 2 support people in the waiting area - these visitors may rotate.   Pre-op nurse will coordinate an appropriate time for 1 ADULT support  person, who may not rotate, to accompany patient in pre-op.  Children under the age of 60 must have an adult with them who is not the patient and must remain in the main waiting area with an adult.  If the patient needs to stay at the hospital during part of their recovery, the visitor guidelines for inpatient rooms apply.  Please refer to the [facility] website for the visitor guidelines for any additional information.   If you received a COVID test during your pre-op visit  it is requested that you wear a mask when out in public, stay away from anyone that may not be feeling well and notify your surgeon if you develop symptoms. If you have been in contact with anyone that has tested positive in the last 10 days please notify you surgeon.      Pre-operative CHG Bathing Instructions   You can play a key role in reducing the risk of infection after surgery. Your skin needs to be as free of germs as possible. You can reduce the number of germs on your skin by washing with CHG (chlorhexidine gluconate) soap before surgery. CHG is an antiseptic soap that kills germs and continues to kill germs even after washing.   DO NOT use if you have an allergy to chlorhexidine/CHG or antibacterial soaps. If your skin becomes reddened or irritated, stop using the CHG and notify one of our RNs at [phone].  TAKE A SHOWER THE NIGHT BEFORE SURGERY AND THE DAY OF SURGERY    Please keep in mind the following:  DO NOT shave, including legs and underarms, 48 hours prior to surgery.   You may shave your face before/day of surgery.  Place clean sheets on your bed the night before surgery Use a clean washcloth (not used since being washed) for each shower. DO NOT sleep with pet's night before surgery.  CHG Shower Instructions:  Wash your face and private area with normal soap. If you choose to wash your hair, wash first with your normal shampoo.  After you use shampoo/soap, rinse your hair and  body thoroughly to remove shampoo/soap residue.  Turn the water OFF and apply half the bottle of CHG soap to a CLEAN washcloth.  Apply CHG soap ONLY FROM YOUR NECK DOWN TO YOUR TOES (washing for 3-5 minutes)  DO NOT use CHG soap on face, private areas, open wounds, or sores.  Pay special attention to the area where your surgery is being performed.  If you are having back surgery, having someone wash your back for you may be helpful. Wait 2 minutes after CHG soap is applied, then you may rinse off the CHG soap.  Pat dry with a clean towel  Put on clean pajamas    Additional instructions for the day of surgery: DO NOT APPLY any lotions, deodorants or perfumes.   Do not wear jewelry or makeup Do not wear nail polish, gel polish, artificial nails, or any other type of covering on natural nails (fingers and toes) Do not bring valuables to the hospital. [facility] is not responsible for valuables/personal belongings. Put on clean/comfortable clothes.  Please brush your teeth.  Ask your nurse before applying any prescription medications to the skin.

## 2023-05-22 ENCOUNTER — Encounter (HOSPITAL_COMMUNITY)
Admission: RE | Admit: 2023-05-22 | Discharge: 2023-05-22 | Disposition: A | Discharge status: Home / Self Care | Payer: BC Managed Care – PPO | Source: Ambulatory Visit | Attending: Orthopedic Surgery | Admitting: Orthopedic Surgery

## 2023-05-22 ENCOUNTER — Encounter (HOSPITAL_COMMUNITY): Payer: Self-pay

## 2023-05-22 ENCOUNTER — Other Ambulatory Visit: Payer: Self-pay

## 2023-05-22 VITALS — BP 154/97 | HR 99 | Temp 98.4°F | Resp 16 | Ht 72.0 in | Wt 208.7 lb

## 2023-05-22 DIAGNOSIS — I7300 Raynaud's syndrome without gangrene: Secondary | ICD-10-CM | POA: Insufficient documentation

## 2023-05-22 DIAGNOSIS — Q249 Congenital malformation of heart, unspecified: Secondary | ICD-10-CM | POA: Insufficient documentation

## 2023-05-22 DIAGNOSIS — Q210 Ventricular septal defect: Secondary | ICD-10-CM | POA: Insufficient documentation

## 2023-05-22 DIAGNOSIS — M50123 Cervical disc disorder at C6-C7 level with radiculopathy: Secondary | ICD-10-CM | POA: Insufficient documentation

## 2023-05-22 DIAGNOSIS — M50122 Cervical disc disorder at C5-C6 level with radiculopathy: Secondary | ICD-10-CM | POA: Insufficient documentation

## 2023-05-22 DIAGNOSIS — Z01818 Encounter for other preprocedural examination: Secondary | ICD-10-CM | POA: Diagnosis not present

## 2023-05-22 DIAGNOSIS — Z87891 Personal history of nicotine dependence: Secondary | ICD-10-CM | POA: Insufficient documentation

## 2023-05-22 HISTORY — DX: Nausea with vomiting, unspecified: R11.2

## 2023-05-22 HISTORY — DX: Other specified postprocedural states: Z98.890

## 2023-05-22 LAB — CBC
HCT: 47.2 % — ABNORMAL HIGH (ref 36.0–46.0)
Hemoglobin: 15.2 g/dL — ABNORMAL HIGH (ref 12.0–15.0)
MCH: 27.7 pg (ref 26.0–34.0)
MCHC: 32.2 g/dL (ref 30.0–36.0)
MCV: 86.1 fL (ref 80.0–100.0)
Platelets: 252 10*3/uL (ref 150–400)
RBC: 5.48 MIL/uL — ABNORMAL HIGH (ref 3.87–5.11)
RDW: 12.1 % (ref 11.5–15.5)
WBC: 6.7 10*3/uL (ref 4.0–10.5)
nRBC: 0 % (ref 0.0–0.2)

## 2023-05-22 LAB — BASIC METABOLIC PANEL
Anion gap: 6 (ref 5–15)
BUN: 8 mg/dL (ref 6–20)
CO2: 26 mmol/L (ref 22–32)
Calcium: 9.2 mg/dL (ref 8.9–10.3)
Chloride: 106 mmol/L (ref 98–111)
Creatinine, Ser: 1.02 mg/dL — ABNORMAL HIGH (ref 0.44–1.00)
GFR, Estimated: >60 mL/min (ref >60)
Glucose, Bld: 86 mg/dL (ref 70–99)
Potassium: 4.3 mmol/L (ref 3.5–5.1)
Sodium: 138 mmol/L (ref 135–145)

## 2023-05-22 LAB — SURGICAL PCR SCREEN
MRSA, PCR: NEGATIVE
Staphylococcus aureus: POSITIVE — AB

## 2023-05-22 NOTE — Progress Notes (Signed)
Surgical Instructions   Your procedure is scheduled on Thursday, November 14th, 2024. Report to [facility] Main Entrance "A" at 5:30 A.M., then check in with the Admitting office. Any questions or running late day of surgery: call [phone]  Questions prior to your surgery date: call [phone], Monday-Friday, 8am-4pm. If you experience any cold or flu symptoms such as cough, fever, chills, shortness of breath, etc. between now and your scheduled surgery, please notify us at the above number.     Remember:  Do not eat after midnight the night before your surgery  You may drink clear liquids until 4:30 the morning of your surgery.   Clear liquids allowed are: Water, Non-Citrus Juices (without pulp), Carbonated Beverages, Clear Tea (no milk, honey, etc.), Black Coffee Only (NO MILK, CREAM OR POWDERED CREAMER of any kind), and Gatorade.    Take these medicines the morning of surgery with A SIP OF WATER: Atorvastatin (Lipitor)    May take these medicines IF NEEDED:  Oxycodone-acetaminophen (Percocet)    One week prior to surgery, STOP taking any Aspirin (unless otherwise instructed by your surgeon) Aleve, Naproxen, Ibuprofen, Motrin, Advil, Goody's, BC's, all herbal medications, fish oil, and non-prescription vitamins.                     Do NOT Smoke (Tobacco/Vaping) for 24 hours prior to your procedure.  If you use a CPAP at night, you may bring your mask/headgear for your overnight stay.   You will be asked to remove any contacts, glasses, piercing's, hearing aid's, dentures/partials prior to surgery. Please bring cases for these items if needed.    Patients discharged the day of surgery will not be allowed to drive home, and someone needs to stay with them for 24 hours.  SURGICAL WAITING ROOM VISITATION Patients may have no more than 2 support people in the waiting area - these visitors may rotate.   Pre-op nurse will coordinate an appropriate time for 1 ADULT support  person, who may not rotate, to accompany patient in pre-op.  Children under the age of 44 must have an adult with them who is not the patient and must remain in the main waiting area with an adult.  If the patient needs to stay at the hospital during part of their recovery, the visitor guidelines for inpatient rooms apply.  Please refer to the [facility] website for the visitor guidelines for any additional information.   If you received a COVID test during your pre-op visit  it is requested that you wear a mask when out in public, stay away from anyone that may not be feeling well and notify your surgeon if you develop symptoms. If you have been in contact with anyone that has tested positive in the last 10 days please notify you surgeon.      Pre-operative 5 CHG Bathing Instructions   You can play a key role in reducing the risk of infection after surgery. Your skin needs to be as free of germs as possible. You can reduce the number of germs on your skin by washing with CHG (chlorhexidine gluconate) soap before surgery. CHG is an antiseptic soap that kills germs and continues to kill germs even after washing.   DO NOT use if you have an allergy to chlorhexidine/CHG or antibacterial soaps. If your skin becomes reddened or irritated, stop using the CHG and notify one of our RNs at [phone].   Please shower with the CHG soap starting 4 days  before surgery using the following schedule:     Please keep in mind the following:  DO NOT shave, including legs and underarms, starting the day of your first shower.   You may shave your face at any point before/day of surgery.  Place clean sheets on your bed the day you start using CHG soap. Use a clean washcloth (not used since being washed) for each shower. DO NOT sleep with pets once you start using the CHG.   CHG Shower Instructions:  Wash your face and private area with normal soap. If you choose to wash your hair, wash first with your  normal shampoo.  After you use shampoo/soap, rinse your hair and body thoroughly to remove shampoo/soap residue.  Turn the water OFF and apply about 3 tablespoons (45 ml) of CHG soap to a CLEAN washcloth.  Apply CHG soap ONLY FROM YOUR NECK DOWN TO YOUR TOES (washing for 3-5 minutes)  DO NOT use CHG soap on face, private areas, open wounds, or sores.  Pay special attention to the area where your surgery is being performed.  If you are having back surgery, having someone wash your back for you may be helpful. Wait 2 minutes after CHG soap is applied, then you may rinse off the CHG soap.  Pat dry with a clean towel  Put on clean clothes/pajamas   If you choose to wear lotion, please use ONLY the CHG-compatible lotions on the back of this paper.   Additional instructions for the day of surgery: DO NOT APPLY any lotions, deodorants, cologne, or perfumes.   Do not bring valuables to the hospital. [facility] is not responsible for any belongings/valuables. Do not wear nail polish, gel polish, artificial nails, or any other type of covering on natural nails (fingers and toes) Do not wear jewelry or makeup Put on clean/comfortable clothes.  Please brush your teeth.  Ask your nurse before applying any prescription medications to the skin.     CHG Compatible Lotions   Aveeno Moisturizing lotion  Cetaphil Moisturizing Cream  Cetaphil Moisturizing Lotion  Clairol Herbal Essence Moisturizing Lotion, Dry Skin  Clairol Herbal Essence Moisturizing Lotion, Extra Dry Skin  Clairol Herbal Essence Moisturizing Lotion, Normal Skin  Curel Age Defying Therapeutic Moisturizing Lotion with Alpha Hydroxy  Curel Extreme Care Body Lotion  Curel Soothing Hands Moisturizing Hand Lotion  Curel Therapeutic Moisturizing Cream, Fragrance-Free  Curel Therapeutic Moisturizing Lotion, Fragrance-Free  Curel Therapeutic Moisturizing Lotion, Original Formula  Eucerin Daily Replenishing Lotion  Eucerin Dry Skin  Therapy Plus Alpha Hydroxy Crme  Eucerin Dry Skin Therapy Plus Alpha Hydroxy Lotion  Eucerin Original Crme  Eucerin Original Lotion  Eucerin Plus Crme Eucerin Plus Lotion  Eucerin TriLipid Replenishing Lotion  Keri Anti-Bacterial Hand Lotion  Keri Deep Conditioning Original Lotion Dry Skin Formula Softly Scented  Keri Deep Conditioning Original Lotion, Fragrance Free Sensitive Skin Formula  Keri Lotion Fast Absorbing Fragrance Free Sensitive Skin Formula  Keri Lotion Fast Absorbing Softly Scented Dry Skin Formula  Keri Original Lotion  Keri Skin Renewal Lotion Keri Silky Smooth Lotion  Keri Silky Smooth Sensitive Skin Lotion  Nivea Body Creamy Conditioning Oil  Nivea Body Extra Enriched Lotion  Nivea Body Original Lotion  Nivea Body Sheer Moisturizing Lotion Nivea Crme  Nivea Skin Firming Lotion  NutraDerm 30 Skin Lotion  NutraDerm Skin Lotion  NutraDerm Therapeutic Skin Cream  NutraDerm Therapeutic Skin Lotion  ProShield Protective Hand Cream  Provon moisturizing lotion  Please read over the following fact sheets that  you were given.

## 2023-05-22 NOTE — Progress Notes (Signed)
PCP - Alyssa Allwardt Cardiologist - Dr. Norman Herrlich    -clearance note 04/06/23  PPM/ICD - denies Device Orders - n/a Rep Notified - n/a  Chest x-ray - denies EKG - 04/06/23 Stress Test - greater than 5 years - not sure when ECHO - 08/04/22 Cardiac Cath - denies  Sleep Study - denies CPAP - n/a  Fasting Blood Sugar - n/a   Blood Thinner Instructions: n/a Aspirin Instructions: n/a  ERAS Protcol - clears until 0430  COVID TEST-  n/a   Anesthesia review:  yes - VSD, SVT, Aortic Root dilation  Patient denies shortness of breath, fever, cough and chest pain at PAT appointment   All instructions explained to the patient, with a verbal understanding of the material. Patient agrees to go over the instructions while at home for a better understanding. Patient also instructed to self quarantine after being tested for COVID-19. The opportunity to ask questions was provided.   Patient's diastolic elevated at PAT appointment.  Vanessa Thomas made aware.  Patient denies any symptoms.  Patient states she will monitor blood pressure at home and will reach out to PCP if it remains elevated.

## 2023-05-23 NOTE — Anesthesia Preprocedure Evaluation (Signed)
Anesthesia Evaluation  Patient identified by MRN, date of birth, ID band Patient awake    Reviewed: Allergy & Precautions, NPO status , Patient's Chart, lab work & pertinent test results  History of Anesthesia Complications (+) PONV and history of anesthetic complications  Airway Mallampati: II  TM Distance: >3 FB Neck ROM: Limited    Dental  (+) Dental Advisory Given   Pulmonary neg shortness of breath, neg sleep apnea, neg COPD, neg recent URI, former smoker   Pulmonary exam normal breath sounds clear to auscultation       Cardiovascular hypertension (metoprolol), Pt. on home beta blockers (-) angina (-) Past MI, (-) Cardiac Stents and (-) CABG + dysrhythmias (RBBB) Supra Ventricular Tachycardia  Rhythm:Regular Rate:Normal  S/p VSD repair  Echo 08/04/22: IMPRESSIONS  1. Left ventricular ejection fraction, by estimation, is 60 to 65%. The  left ventricle has normal function. The left ventricle has no regional  wall motion abnormalities. Left ventricular diastolic parameters were  normal.  2. Right ventricular systolic function is normal. The right ventricular  size is normal.  3. Cannot rule out PFO/ASD.  4. No evidence of mitral valve regurgitation.  5. The aortic valve was not well visualized. Aortic valve regurgitation  is not visualized.  6. Aneurysm of the aortic root, measuring 41 mm.  - Conclusion(s)/Recommendation(s): Cannot rule out PFO/ASD. If suspicion is  high for a cardioembolic source for CVA, consider a TEE.     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  Neuromuscular disease (cervical radiculopathy)    GI/Hepatic negative GI ROS, Neg liver ROS,,,  Endo/Other  negative endocrine ROS    Renal/GU negative Renal ROS     Musculoskeletal  (+) Arthritis ,    Abdominal   Peds  Hematology negative hematology  ROS (+) Lab Results      Component                Value               Date                      WBC                      6.7                 05/22/2023                HGB                      15.2 (H)            05/22/2023                HCT                      47.2 (H)            05/22/2023                MCV                      86.1                05/22/2023                PLT  252                 05/22/2023              Anesthesia Other Findings   Reproductive/Obstetrics                             Anesthesia Physical Anesthesia Plan  ASA: 3  Anesthesia Plan: General   Post-op Pain Management: Tylenol PO (pre-op)*   Induction: Intravenous  PONV Risk Score and Plan: 4 or greater and Ondansetron, Dexamethasone, Treatment may vary due to age or medical condition, Propofol infusion and TIVA  Airway Management Planned: Oral ETT and Video Laryngoscope Planned  Additional Equipment:   Intra-op Plan:   Post-operative Plan: Extubation in OR  Informed Consent: I have reviewed the patients History and Physical, chart, labs and discussed the procedure including the risks, benefits and alternatives for the proposed anesthesia with the patient or authorized representative who has indicated his/her understanding and acceptance.     Dental advisory given  Plan Discussed with: CRNA and Anesthesiologist  Anesthesia Plan Comments: (PAT note written 05/23/2023 by Shonna Chock, PA-C.  Risks of general anesthesia discussed including, but not limited to, sore throat, hoarse voice, chipped/damaged teeth, injury to vocal cords, nausea and vomiting, allergic reactions, lung infection, heart attack, stroke, and death. All questions answered.   )       Anesthesia Quick Evaluation
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
## 2023-05-25 ENCOUNTER — Encounter (HOSPITAL_COMMUNITY)
Admission: RE | Disposition: A | Discharge status: Home / Self Care | Payer: Self-pay | Source: Home / Self Care | Attending: Orthopedic Surgery

## 2023-05-25 ENCOUNTER — Ambulatory Visit (HOSPITAL_COMMUNITY): Payer: BC Managed Care – PPO | Admitting: Vascular Surgery

## 2023-05-25 ENCOUNTER — Ambulatory Visit (HOSPITAL_COMMUNITY): Payer: BC Managed Care – PPO

## 2023-05-25 ENCOUNTER — Observation Stay (HOSPITAL_COMMUNITY)
Admission: RE | Admit: 2023-05-25 | Discharge: 2023-05-26 | Disposition: A | Discharge status: Home / Self Care | Payer: BC Managed Care – PPO | Attending: Orthopedic Surgery | Admitting: Orthopedic Surgery

## 2023-05-25 ENCOUNTER — Encounter (HOSPITAL_COMMUNITY): Payer: Self-pay | Admitting: Orthopedic Surgery

## 2023-05-25 ENCOUNTER — Ambulatory Visit (HOSPITAL_COMMUNITY): Payer: Self-pay | Admitting: Anesthesiology

## 2023-05-25 ENCOUNTER — Other Ambulatory Visit: Payer: Self-pay

## 2023-05-25 DIAGNOSIS — M50122 Cervical disc disorder at C5-C6 level with radiculopathy: Secondary | ICD-10-CM | POA: Diagnosis not present

## 2023-05-25 DIAGNOSIS — I10 Essential (primary) hypertension: Secondary | ICD-10-CM | POA: Diagnosis not present

## 2023-05-25 DIAGNOSIS — J449 Chronic obstructive pulmonary disease, unspecified: Secondary | ICD-10-CM | POA: Diagnosis not present

## 2023-05-25 DIAGNOSIS — M50123 Cervical disc disorder at C6-C7 level with radiculopathy: Secondary | ICD-10-CM | POA: Insufficient documentation

## 2023-05-25 DIAGNOSIS — Z0189 Encounter for other specified special examinations: Secondary | ICD-10-CM | POA: Diagnosis not present

## 2023-05-25 DIAGNOSIS — G959 Disease of spinal cord, unspecified: Principal | ICD-10-CM | POA: Diagnosis present

## 2023-05-25 DIAGNOSIS — M5010 Cervical disc disorder with radiculopathy, unspecified cervical region: Secondary | ICD-10-CM | POA: Diagnosis not present

## 2023-05-25 DIAGNOSIS — Z01818 Encounter for other preprocedural examination: Secondary | ICD-10-CM

## 2023-05-25 DIAGNOSIS — Z87891 Personal history of nicotine dependence: Secondary | ICD-10-CM | POA: Diagnosis not present

## 2023-05-25 HISTORY — DX: Disease of spinal cord, unspecified: G95.9

## 2023-05-25 LAB — POCT PREGNANCY, URINE: Preg Test, Ur: NEGATIVE

## 2023-05-25 SURGERY — CERVICAL ANTERIOR DISC ARTHROPLASTY, 2 LEVEL
Anesthesia: General

## 2023-05-25 MED ORDER — BSS IO SOLN
15.0000 mL | Freq: Once | INTRAOCULAR | Status: DC
  Filled 2023-05-25 (×2): qty 15

## 2023-05-25 MED ORDER — ACETAMINOPHEN 650 MG RE SUPP: 650.0000 mg | RECTAL | Status: DC | PRN

## 2023-05-25 MED ORDER — DEXAMETHASONE SODIUM PHOSPHATE 10 MG/ML IJ SOLN
INTRAMUSCULAR | Status: DC | PRN
  Administered 2023-05-25: 10 mg via INTRAVENOUS

## 2023-05-25 MED ORDER — THROMBIN 20000 UNITS EX SOLR
CUTANEOUS | Status: DC | PRN
  Administered 2023-05-25: 20 mL via TOPICAL

## 2023-05-25 MED ORDER — KETAMINE HCL 10 MG/ML IJ SOLN
INTRAMUSCULAR | Status: DC | PRN
  Administered 2023-05-25: 20 mg via INTRAVENOUS
  Administered 2023-05-25: 30 mg via INTRAVENOUS

## 2023-05-25 MED ORDER — PROPOFOL 1000 MG/100ML IV EMUL
INTRAVENOUS | Status: AC
  Filled 2023-05-25: qty 100

## 2023-05-25 MED ORDER — THROMBIN 20000 UNITS EX KIT
PACK | CUTANEOUS | Status: AC
  Filled 2023-05-25: qty 1

## 2023-05-25 MED ORDER — ONDANSETRON HCL 4 MG PO TABS
4.0000 mg | ORAL_TABLET | Freq: Four times a day (QID) | ORAL | Status: DC | PRN
Start: 2023-05-25 — End: 2023-05-26

## 2023-05-25 MED ORDER — CEFAZOLIN SODIUM-DEXTROSE 2-4 GM/100ML-% IV SOLN
INTRAVENOUS | Status: AC
  Filled 2023-05-25: qty 100

## 2023-05-25 MED ORDER — BUPIVACAINE-EPINEPHRINE 0.25% -1:200000 IJ SOLN
INTRAMUSCULAR | Status: DC | PRN
  Administered 2023-05-25: 7 mL

## 2023-05-25 MED ORDER — FENTANYL CITRATE (PF) 250 MCG/5ML IJ SOLN
INTRAMUSCULAR | Status: AC
  Filled 2023-05-25: qty 5

## 2023-05-25 MED ORDER — CEFAZOLIN SODIUM-DEXTROSE 1-4 GM/50ML-% IV SOLN
INTRAVENOUS | Status: AC
  Filled 2023-05-25: qty 50

## 2023-05-25 MED ORDER — MENTHOL 3 MG MT LOZG: 1.0000 | LOZENGE | OROMUCOSAL | Status: DC | PRN

## 2023-05-25 MED ORDER — KETAMINE HCL 50 MG/5ML IJ SOSY
PREFILLED_SYRINGE | INTRAMUSCULAR | Status: AC
  Filled 2023-05-25: qty 5

## 2023-05-25 MED ORDER — OXYCODONE HCL 5 MG PO TABS
5.0000 mg | ORAL_TABLET | ORAL | Status: DC | PRN
Start: 2023-05-25 — End: 2023-05-26

## 2023-05-25 MED ORDER — MIDAZOLAM HCL 2 MG/2ML IJ SOLN
INTRAMUSCULAR | Status: AC
  Filled 2023-05-25: qty 2

## 2023-05-25 MED ORDER — SODIUM CHLORIDE 0.9 % IV SOLN
250.0000 mL | INTRAVENOUS | Status: DC
  Administered 2023-05-26: 250 mL via INTRAVENOUS

## 2023-05-25 MED ORDER — MIDAZOLAM HCL 2 MG/2ML IJ SOLN
INTRAMUSCULAR | Status: DC | PRN
  Administered 2023-05-25: 2 mg via INTRAVENOUS

## 2023-05-25 MED ORDER — ONDANSETRON HCL 4 MG PO TABS: 4.0000 mg | ORAL_TABLET | Freq: Three times a day (TID) | ORAL | 0 refills | Status: DC | PRN

## 2023-05-25 MED ORDER — ROCURONIUM BROMIDE 10 MG/ML (PF) SYRINGE
PREFILLED_SYRINGE | INTRAVENOUS | Status: AC
  Filled 2023-05-25: qty 10

## 2023-05-25 MED ORDER — LIDOCAINE 2% (20 MG/ML) 5 ML SYRINGE
INTRAMUSCULAR | Status: DC | PRN
  Administered 2023-05-25: 60 mg via INTRAVENOUS

## 2023-05-25 MED ORDER — KETOROLAC TROMETHAMINE 0.5 % OP SOLN
OPHTHALMIC | Status: AC
  Filled 2023-05-25: qty 5

## 2023-05-25 MED ORDER — AMISULPRIDE (ANTIEMETIC) 5 MG/2ML IV SOLN: 10.0000 mg | Freq: Once | INTRAVENOUS | Status: DC | PRN

## 2023-05-25 MED ORDER — DEXAMETHASONE SODIUM PHOSPHATE 4 MG/ML IJ SOLN
4.0000 mg | Freq: Four times a day (QID) | INTRAMUSCULAR | Status: DC
  Administered 2023-05-25: 4 mg via INTRAVENOUS
  Filled 2023-05-25: qty 1

## 2023-05-25 MED ORDER — ROCURONIUM BROMIDE 10 MG/ML (PF) SYRINGE
PREFILLED_SYRINGE | INTRAVENOUS | Status: DC | PRN
  Administered 2023-05-25: 50 mg via INTRAVENOUS
  Administered 2023-05-25: 60 mg via INTRAVENOUS
  Administered 2023-05-25 (×2): 20 mg via INTRAVENOUS
  Administered 2023-05-25: 30 mg via INTRAVENOUS
  Administered 2023-05-25 (×2): 20 mg via INTRAVENOUS
  Administered 2023-05-25: 40 mg via INTRAVENOUS

## 2023-05-25 MED ORDER — ACETAMINOPHEN 325 MG PO TABS
650.0000 mg | ORAL_TABLET | ORAL | Status: DC | PRN
  Administered 2023-05-25 – 2023-05-26 (×2): 650 mg via ORAL
  Filled 2023-05-25 (×2): qty 2

## 2023-05-25 MED ORDER — HYDROMORPHONE HCL 1 MG/ML IJ SOLN
1.0000 mg | INTRAMUSCULAR | Status: DC | PRN
Start: 2023-05-25 — End: 2023-05-26
  Administered 2023-05-25 (×4): 1 mg via INTRAVENOUS
  Filled 2023-05-25 (×4): qty 1

## 2023-05-25 MED ORDER — LACTATED RINGERS IV SOLN: INTRAVENOUS | Status: DC

## 2023-05-25 MED ORDER — SCOPOLAMINE 1 MG/3DAYS TD PT72
MEDICATED_PATCH | TRANSDERMAL | Status: DC | PRN
  Administered 2023-05-25: 1 via TRANSDERMAL

## 2023-05-25 MED ORDER — METHOCARBAMOL 1000 MG/10ML IJ SOLN: 500.0000 mg | Freq: Four times a day (QID) | INTRAMUSCULAR | Status: DC | PRN

## 2023-05-25 MED ORDER — MAGNESIUM CITRATE PO SOLN: 1.0000 | Freq: Once | ORAL | Status: DC | PRN

## 2023-05-25 MED ORDER — KETOROLAC TROMETHAMINE 0.5 % OP SOLN
1.0000 [drp] | Freq: Four times a day (QID) | OPHTHALMIC | Status: AC
  Administered 2023-05-25 – 2023-05-26 (×4): 1 [drp] via OPHTHALMIC

## 2023-05-25 MED ORDER — LIDOCAINE 2% (20 MG/ML) 5 ML SYRINGE
INTRAMUSCULAR | Status: AC
  Filled 2023-05-25: qty 5

## 2023-05-25 MED ORDER — PROPOFOL 10 MG/ML IV BOLUS
INTRAVENOUS | Status: AC
  Filled 2023-05-25: qty 20

## 2023-05-25 MED ORDER — 0.9 % SODIUM CHLORIDE (POUR BTL) OPTIME
TOPICAL | Status: DC | PRN
  Administered 2023-05-25 (×2): 1000 mL

## 2023-05-25 MED ORDER — METHOCARBAMOL 500 MG PO TABS: 500.0000 mg | ORAL_TABLET | Freq: Three times a day (TID) | ORAL | 0 refills | Status: AC | PRN

## 2023-05-25 MED ORDER — CHLORHEXIDINE GLUCONATE 0.12 % MT SOLN
15.0000 mL | Freq: Once | OROMUCOSAL | Status: AC
  Administered 2023-05-25: 15 mL via OROMUCOSAL
  Filled 2023-05-25: qty 15

## 2023-05-25 MED ORDER — DEXAMETHASONE 4 MG PO TABS
4.0000 mg | ORAL_TABLET | Freq: Four times a day (QID) | ORAL | Status: DC
  Administered 2023-05-25 – 2023-05-26 (×2): 4 mg via ORAL
  Filled 2023-05-25 (×3): qty 1

## 2023-05-25 MED ORDER — METOPROLOL SUCCINATE ER 50 MG PO TB24
100.0000 mg | ORAL_TABLET | Freq: Every day | ORAL | Status: DC
  Administered 2023-05-25 – 2023-05-26 (×2): 100 mg via ORAL
  Filled 2023-05-25 (×2): qty 2

## 2023-05-25 MED ORDER — SUGAMMADEX SODIUM 200 MG/2ML IV SOLN
INTRAVENOUS | Status: DC | PRN
  Administered 2023-05-25: 200 mg via INTRAVENOUS

## 2023-05-25 MED ORDER — ONDANSETRON HCL 4 MG/2ML IJ SOLN: 4.0000 mg | Freq: Four times a day (QID) | INTRAMUSCULAR | Status: DC | PRN

## 2023-05-25 MED ORDER — OXYCODONE HCL 5 MG PO TABS
10.0000 mg | ORAL_TABLET | ORAL | Status: DC | PRN
  Administered 2023-05-25 – 2023-05-26 (×5): 10 mg via ORAL
  Filled 2023-05-25 (×5): qty 2

## 2023-05-25 MED ORDER — ACETAMINOPHEN 500 MG PO TABS
1000.0000 mg | ORAL_TABLET | Freq: Once | ORAL | Status: AC
  Administered 2023-05-25: 1000 mg via ORAL
  Filled 2023-05-25: qty 2

## 2023-05-25 MED ORDER — OXYCODONE-ACETAMINOPHEN 10-325 MG PO TABS: 1.0000 | ORAL_TABLET | Freq: Four times a day (QID) | ORAL | 0 refills | Status: AC | PRN

## 2023-05-25 MED ORDER — DEXMEDETOMIDINE HCL IN NACL 80 MCG/20ML IV SOLN
INTRAVENOUS | Status: DC | PRN
  Administered 2023-05-25: 12 ug via INTRAVENOUS

## 2023-05-25 MED ORDER — DEXMEDETOMIDINE HCL IN NACL 80 MCG/20ML IV SOLN
INTRAVENOUS | Status: AC
  Filled 2023-05-25: qty 20

## 2023-05-25 MED ORDER — PROPOFOL 500 MG/50ML IV EMUL
INTRAVENOUS | Status: DC | PRN
  Administered 2023-05-25: 250 ug/kg/min via INTRAVENOUS
  Administered 2023-05-25: 150 ug/kg/min via INTRAVENOUS
  Administered 2023-05-25 (×3): 250 ug/kg/min via INTRAVENOUS

## 2023-05-25 MED ORDER — DEXAMETHASONE SODIUM PHOSPHATE 10 MG/ML IJ SOLN
INTRAMUSCULAR | Status: AC
  Filled 2023-05-25: qty 1

## 2023-05-25 MED ORDER — SODIUM CHLORIDE 0.9% FLUSH: 3.0000 mL | INTRAVENOUS | Status: DC | PRN

## 2023-05-25 MED ORDER — OXYCODONE HCL 5 MG PO TABS: 5.0000 mg | ORAL_TABLET | Freq: Once | ORAL | Status: DC | PRN

## 2023-05-25 MED ORDER — PROPOFOL 10 MG/ML IV BOLUS
INTRAVENOUS | Status: DC | PRN
  Administered 2023-05-25: 50 mg via INTRAVENOUS
  Administered 2023-05-25: 150 mg via INTRAVENOUS
  Administered 2023-05-25 (×3): 50 mg via INTRAVENOUS

## 2023-05-25 MED ORDER — ONDANSETRON HCL 4 MG/2ML IJ SOLN
INTRAMUSCULAR | Status: AC
  Filled 2023-05-25: qty 2

## 2023-05-25 MED ORDER — ATORVASTATIN CALCIUM 10 MG PO TABS
20.0000 mg | ORAL_TABLET | Freq: Every day | ORAL | Status: DC
  Administered 2023-05-25 – 2023-05-26 (×2): 20 mg via ORAL
  Filled 2023-05-25 (×2): qty 2

## 2023-05-25 MED ORDER — POLYETHYLENE GLYCOL 3350 17 G PO PACK: 17.0000 g | PACK | Freq: Every day | ORAL | Status: DC | PRN

## 2023-05-25 MED ORDER — CEFAZOLIN SODIUM-DEXTROSE 1-4 GM/50ML-% IV SOLN
1.0000 g | Freq: Three times a day (TID) | INTRAVENOUS | Status: AC
  Administered 2023-05-25 (×2): 1 g via INTRAVENOUS
  Filled 2023-05-25: qty 50

## 2023-05-25 MED ORDER — PHENOL 1.4 % MT LIQD: 1.0000 | OROMUCOSAL | Status: DC | PRN

## 2023-05-25 MED ORDER — FENTANYL CITRATE (PF) 100 MCG/2ML IJ SOLN
25.0000 ug | INTRAMUSCULAR | Status: DC | PRN
  Administered 2023-05-25: 50 ug via INTRAVENOUS
  Administered 2023-05-25 (×2): 25 ug via INTRAVENOUS

## 2023-05-25 MED ORDER — ORAL CARE MOUTH RINSE: 15.0000 mL | Freq: Once | OROMUCOSAL | Status: AC

## 2023-05-25 MED ORDER — ONDANSETRON HCL 4 MG/2ML IJ SOLN
INTRAMUSCULAR | Status: DC | PRN
  Administered 2023-05-25: 4 mg via INTRAVENOUS

## 2023-05-25 MED ORDER — BUPIVACAINE-EPINEPHRINE (PF) 0.25% -1:200000 IJ SOLN
INTRAMUSCULAR | Status: AC
  Filled 2023-05-25: qty 30

## 2023-05-25 MED ORDER — METHOCARBAMOL 500 MG PO TABS
500.0000 mg | ORAL_TABLET | Freq: Four times a day (QID) | ORAL | Status: DC | PRN
Start: 2023-05-25 — End: 2023-05-26
  Administered 2023-05-25 – 2023-05-26 (×3): 500 mg via ORAL
  Filled 2023-05-25 (×3): qty 1

## 2023-05-25 MED ORDER — CEFAZOLIN SODIUM-DEXTROSE 2-4 GM/100ML-% IV SOLN
2.0000 g | INTRAVENOUS | Status: AC
Start: 2023-05-25 — End: 2023-05-25
  Administered 2023-05-25: 2 g via INTRAVENOUS
  Filled 2023-05-25: qty 100

## 2023-05-25 MED ORDER — TRANEXAMIC ACID-NACL 1000-0.7 MG/100ML-% IV SOLN
1000.0000 mg | INTRAVENOUS | Status: AC
  Administered 2023-05-25: 1000 mg via INTRAVENOUS
  Filled 2023-05-25: qty 100

## 2023-05-25 MED ORDER — FENTANYL CITRATE (PF) 100 MCG/2ML IJ SOLN
INTRAMUSCULAR | Status: AC
  Filled 2023-05-25: qty 2

## 2023-05-25 MED ORDER — FENTANYL CITRATE (PF) 250 MCG/5ML IJ SOLN
INTRAMUSCULAR | Status: DC | PRN
  Administered 2023-05-25 (×8): 50 ug via INTRAVENOUS

## 2023-05-25 MED ORDER — SODIUM CHLORIDE 0.9% FLUSH: 3.0000 mL | Freq: Two times a day (BID) | INTRAVENOUS | Status: DC

## 2023-05-25 MED ORDER — OXYCODONE HCL 5 MG/5ML PO SOLN: 5.0000 mg | Freq: Once | ORAL | Status: DC | PRN

## 2023-05-25 SURGICAL SUPPLY — 55 items
BAG COUNTER SPONGE SURGICOUNT (BAG) ×1 IMPLANT
BAND RUBBER #18 3X1/16 STRL (MISCELLANEOUS) IMPLANT
BLADE CLIPPER SURG (BLADE) IMPLANT
CABLE BIPOLOR RESECTION CORD (MISCELLANEOUS) ×1 IMPLANT
CANISTER SUCT 3000ML PPV (MISCELLANEOUS) ×1 IMPLANT
CLSR STERI-STRIP ANTIMIC 1/2X4 (GAUZE/BANDAGES/DRESSINGS) ×1 IMPLANT
COLLAR CERV LO CONTOUR FIRM DE (SOFTGOODS) IMPLANT
COVER MAYO STAND STRL (DRAPES) ×2 IMPLANT
COVER SURGICAL LIGHT HANDLE (MISCELLANEOUS) ×2 IMPLANT
DISC SIMPLIFY 2 HT4 (Neuro Prosthesis/Implant) IMPLANT
DRAIN CHANNEL 15F RND FF W/TCR (WOUND CARE) IMPLANT
DRAPE C-ARM 42X72 X-RAY (DRAPES) ×1 IMPLANT
DRAPE POUCH INSTRU U-SHP 10X18 (DRAPES) IMPLANT
DRAPE SURG 17X23 STRL (DRAPES) ×1 IMPLANT
DRAPE U-SHAPE 47X51 STRL (DRAPES) ×1 IMPLANT
DRSG OPSITE POSTOP 4X6 (GAUZE/BANDAGES/DRESSINGS) ×1 IMPLANT
DURAPREP 26ML APPLICATOR (WOUND CARE) ×1 IMPLANT
ELECT COATED BLADE 2.86 ST (ELECTRODE) ×1 IMPLANT
ELECT PENCIL ROCKER SW 15FT (MISCELLANEOUS) ×1 IMPLANT
ELECT REM PT RETURN 9FT ADLT (ELECTROSURGICAL) ×1
ELECTRODE REM PT RTRN 9FT ADLT (ELECTROSURGICAL) ×1 IMPLANT
GLOVE BIO SURGEON STRL SZ 6.5 (GLOVE) ×1 IMPLANT
GLOVE BIOGEL PI IND STRL 6.5 (GLOVE) ×1 IMPLANT
GLOVE BIOGEL PI IND STRL 8.5 (GLOVE) ×1 IMPLANT
GLOVE SS BIOGEL STRL SZ 8.5 (GLOVE) ×1 IMPLANT
GOWN STRL REUS W/TWL 2XL LVL3 (GOWN DISPOSABLE) ×1 IMPLANT
KIT BASIN OR (CUSTOM PROCEDURE TRAY) ×1 IMPLANT
KIT TURNOVER KIT B (KITS) ×1 IMPLANT
NDL SPNL 18GX3.5 QUINCKE PK (NEEDLE) ×1 IMPLANT
NEEDLE SPNL 18GX3.5 QUINCKE PK (NEEDLE) ×1 IMPLANT
NS IRRIG 1000ML POUR BTL (IV SOLUTION) ×1 IMPLANT
PACK ORTHO CERVICAL (CUSTOM PROCEDURE TRAY) ×1 IMPLANT
PACK UNIVERSAL I (CUSTOM PROCEDURE TRAY) ×1 IMPLANT
PAD ARMBOARD 7.5X6 YLW CONV (MISCELLANEOUS) ×2 IMPLANT
PATTIES SURGICAL .5 X.5 (GAUZE/BANDAGES/DRESSINGS) IMPLANT
PATTIES SURGICAL .5 X1 (DISPOSABLE) IMPLANT
PIN DISTRATION 14MM (PIN) IMPLANT
POSITIONER HEAD DONUT 9IN (MISCELLANEOUS) ×1 IMPLANT
SPONGE INTESTINAL PEANUT (DISPOSABLE) ×1 IMPLANT
SPONGE SURGIFOAM ABS GEL 100 (HEMOSTASIS) ×1 IMPLANT
SPONGE T-LAP 4X18 ~~LOC~~+RFID (SPONGE) ×2 IMPLANT
SURGIFLO W/THROMBIN 8M KIT (HEMOSTASIS) ×1 IMPLANT
SUT BONE WAX W31G (SUTURE) ×1 IMPLANT
SUT MNCRL AB 3-0 PS2 27 (SUTURE) ×1 IMPLANT
SUT VIC AB 2-0 CT1 18 (SUTURE) ×1 IMPLANT
SUT VIC AB 3-0 54X BRD REEL (SUTURE) ×1 IMPLANT
SUT VIC AB 3-0 BRD 54 (SUTURE) ×1
SYR BULB IRRIG 60ML STRL (SYRINGE) ×1 IMPLANT
SYR CONTROL 10ML LL (SYRINGE) ×1 IMPLANT
TAPE CLOTH 4X10 WHT NS (GAUZE/BANDAGES/DRESSINGS) ×1 IMPLANT
TAPE UMBILICAL 1/8X30 (MISCELLANEOUS) ×1 IMPLANT
TOWEL GREEN STERILE (TOWEL DISPOSABLE) ×1 IMPLANT
TOWEL GREEN STERILE FF (TOWEL DISPOSABLE) ×1 IMPLANT
TRAY FOLEY MTR SLVR 16FR STAT (SET/KITS/TRAYS/PACK) IMPLANT
WATER STERILE IRR 1000ML POUR (IV SOLUTION) ×1 IMPLANT

## 2023-05-25 NOTE — Anesthesia Postprocedure Evaluation (Signed)
Anesthesia Post Note  Patient: [name]  Procedure(s) Performed: Cervical anterior disc arthroplasty, 2 level (total disc replacement C5-7)     Patient location during evaluation: PACU Anesthesia Type: General Level of consciousness: awake Pain management: pain level controlled Vital Signs Assessment: post-procedure vital signs reviewed and stable Respiratory status: spontaneous breathing, nonlabored ventilation and respiratory function stable Cardiovascular status: blood pressure returned to baseline and stable Postop Assessment: no apparent nausea or vomiting Anesthetic complications: yes (Notified about corneal abrasion after patient had been in PACU almost 2 hours. Patient reports feeling like something is in her eye. It did not improve with saline irrigation. It's red. Denies blurry vision. Corneal abrasion order set placed.)   No notable events documented.  Last Vitals:  Vitals:   05/25/23 1400 05/25/23 1415  BP: (!) 143/95 (!) 145/90  Pulse: 87 79  Resp: (!) 9 14  Temp:    SpO2: 95% 96%    Last Pain:  Vitals:   05/25/23 1415  TempSrc:   PainSc: 10-Worst pain ever                 Linton Rump

## 2023-05-25 NOTE — H&P (Signed)
History:  Horizon is a very pleasant 42 year old woman with significant neck and radicular arm pain. Despite appropriate conservative management her overall quality of life has deteriorated. as result of the failure of conservative treatment we are moving forward with a two-level cervical disc arthroplasty C5-7.  Past Medical History:  Diagnosis Date   Aortic root dilatation (HCC)    Cervical radiculopathy    DORV, VSD (double-outlet right ventricle, ventricular septal defect)    Status post surgical correction   Generalized anxiety disorder    History of colonic polyps    History of nephrolithiasis    Inappropriate sinus tachycardia (HCC)    Status post sinus node modification    Kidney stones    Paroxysmal supraventricular tachycardia (HCC)    No dual AV pathway physiology by EP study    PONV (postoperative nausea and vomiting)    Raynauds disease     Allergies  Allergen Reactions   Meperidine Palpitations    Alters mental status, rapid heart rate     No current facility-administered medications on file prior to encounter.   Current Outpatient Medications on File Prior to Encounter  Medication Sig Dispense Refill   atorvastatin (LIPITOR) 20 MG tablet Take 1 tablet (20 mg total) by mouth daily. 30 tablet 0   ibuprofen (ADVIL) 200 MG tablet Take 200-400 mg by mouth every 6 (six) hours as needed for moderate pain (pain score 4-6).     metoprolol succinate (TOPROL-XL) 100 MG 24 hr tablet Take 100 mg by mouth at bedtime. Take with or immediately following a meal.     oxyCODONE-acetaminophen (PERCOCET) 10-325 MG tablet Take 1 tablet by mouth 4 (four) times daily.     polyethylene glycol (MIRALAX / GLYCOLAX) 17 g packet Take 17 g by mouth daily as needed for moderate constipation.     fluconazole (DIFLUCAN) 150 MG tablet Take 1 tablet (150 mg total) by mouth daily. Take one now and then one again at the end of the antibiotic course. (Patient not taking: Reported on 05/17/2023) 2  tablet 0    Physical Exam: Vitals:   05/25/23 0605  BP: (!) 152/96  Pulse: 83  Resp: 17  Temp: 98.1 F (36.7 C)  SpO2: 96%   Body mass index is 26.58 kg/m. Clinical exam: Liliana is a pleasant individual, who appears younger than their stated age.  She is alert and orientated 3.  No shortness of breath, chest pain.  Abdomen is soft and non-tender, negative loss of bowel and bladder control, no rebound tenderness.  Negative: skin lesions abrasions contusions  Peripheral pulses: 2+ peripheral pulses bilaterally. LE compartments are: Soft and nontender.  Gait pattern: Normal  Assistive devices: None  Neuro: 5/5 motor strength in the upper extremities. Positive numbness and dysesthesias in the upper extremities bilaterally especially in the C7 dermatome on the left side. Negative Lhermitte sign. Negative Hoffman test. 1+ deep tendon reflexes symmetrical in the upper extremity.  Musculoskeletal: Significant neck pain with palpation and range of motion. Positive crepitus. No occipital headaches.  Imaging: X-rays of the cervical spine demonstrate slight decreased overall lumbar lordosis especially in the mid cervical spine C5-7. Mild degenerative disc changes at C5-7 but no significant facet arthrosis. No change from prior x-rays.  Cervical MRI completed on 02/18/2022: no cord signal changes. There is reversal of normal lower cervical lordosis and moderate disc space narrowing C4-T1. There is a left posterior lateral disc protrusion causing moderate to severe stenosis, and a similar right-sided disc protrusion at  C6-7  Cervical MRI: completed on 03/17/2023. No cord signal changes. Progression in the degenerative disc disease and disc protrusion C5-6 posterior lateral to the left causing marked C6 nerve root compression. Progression in the degenerative disc osteophyte at C6-7 primarily to the right affecting the exiting C7 nerve root.  Image: MM 3D DIAGNOSTIC MAMMOGRAM UNILATERAL LEFT  BREAST  Addendum Date: 05/02/2023   ADDENDUM REPORT: 05/02/2023 15:06 ADDENDUM: Patient underwent aspiration of the presumed left breast cyst, with the cyst completely collapsing confirming a benign etiology. Revised Impression: Small benign left breast cyst. Revised recommendation: Annual screening mammography Revised BI-RADS category: 2: Benign. Electronically Signed   By: Amie Portland M.D.   On: 05/02/2023 15:06   Result Date: 05/02/2023 CLINICAL DATA:  Screening recall for a left breast asymmetry. EXAM: DIGITAL DIAGNOSTIC UNILATERAL LEFT MAMMOGRAM WITH TOMOSYNTHESIS AND CAD; ULTRASOUND LEFT BREAST LIMITED TECHNIQUE: Left digital diagnostic mammography and breast tomosynthesis was performed. The images were evaluated with computer-aided detection. ; Targeted ultrasound examination of the left breast was performed. COMPARISON:  Previous exam(s). ACR Breast Density Category b: There are scattered areas of fibroglandular density. FINDINGS: Spot compression tomosynthesis images of the superior anterior left breast demonstrates a focal asymmetry versus subcentimeter obscured mass. Ultrasound targeted to the left breast at 2 o'clock, 5 cm from the nipple demonstrates a near anechoic oval mass measuring 6 x 3 x 6 mm. Ultrasound of the left axilla demonstrates multiple normal-appearing lymph nodes. IMPRESSION: The 6 mm mass in the left breast at 2 o'clock is very likely a benign cyst. RECOMMENDATION: Ultrasound-guided aspiration is recommended for the cyst in the left breast. The patient is soon going to be undergoing a major surgery and would like this to be proven to be benign prior to surgery. I have discussed the findings and recommendations with the patient. If applicable, a reminder letter will be sent to the patient regarding the next appointment. BI-RADS CATEGORY  4: Suspicious. Electronically Signed: By: Frederico Hamman M.D. On: 04/26/2023 12:24   [place] LIMITED ULTRASOUND INCLUDING AXILLA LEFT BREAST    Addendum Date: 05/02/2023   ADDENDUM REPORT: 05/02/2023 15:06 ADDENDUM: Patient underwent aspiration of the presumed left breast cyst, with the cyst completely collapsing confirming a benign etiology. Revised Impression: Small benign left breast cyst. Revised recommendation: Annual screening mammography Revised BI-RADS category: 2: Benign. Electronically Signed   By: Amie Portland M.D.   On: 05/02/2023 15:06   Result Date: 05/02/2023 CLINICAL DATA:  Screening recall for a left breast asymmetry. EXAM: DIGITAL DIAGNOSTIC UNILATERAL LEFT MAMMOGRAM WITH TOMOSYNTHESIS AND CAD; ULTRASOUND LEFT BREAST LIMITED TECHNIQUE: Left digital diagnostic mammography and breast tomosynthesis was performed. The images were evaluated with computer-aided detection. ; Targeted ultrasound examination of the left breast was performed. COMPARISON:  Previous exam(s). ACR Breast Density Category b: There are scattered areas of fibroglandular density. FINDINGS: Spot compression tomosynthesis images of the superior anterior left breast demonstrates a focal asymmetry versus subcentimeter obscured mass. Ultrasound targeted to the left breast at 2 o'clock, 5 cm from the nipple demonstrates a near anechoic oval mass measuring 6 x 3 x 6 mm. Ultrasound of the left axilla demonstrates multiple normal-appearing lymph nodes. IMPRESSION: The 6 mm mass in the left breast at 2 o'clock is very likely a benign cyst. RECOMMENDATION: Ultrasound-guided aspiration is recommended for the cyst in the left breast. The patient is soon going to be undergoing a major surgery and would like this to be proven to be benign prior to surgery. I have discussed the findings  and recommendations with the patient. If applicable, a reminder letter will be sent to the patient regarding the next appointment. BI-RADS CATEGORY  4: Suspicious. Electronically Signed: By: Frederico Hamman M.D. On: 04/26/2023 12:24   US BREAST ASPIRATION LEFT  Result Date:  05/02/2023 CLINICAL DATA:  Patient presents for aspiration of a small left breast cyst. EXAM: ULTRASOUND GUIDED LEFT BREAST CYST ASPIRATION COMPARISON:  Previous exam(s). PROCEDURE: The patient and I discussed the procedure of ultrasound-guided aspiration including benefits and alternatives. We discussed the high likelihood of a successful procedure. We discussed the risks of the procedure including infection, bleeding, tissue injury, and inadequate sampling. Informed written consent was given. The usual time out protocol was performed immediately prior to the procedure. Using sterile technique, 1% lidocaine, under direct ultrasound visualization, needle aspiration of the 6 mm cyst at 2 o'clock, 5 cm the nipple was performed. The cyst collapsed with aspiration. IMPRESSION: Ultrasound-guided aspiration of a benign left breast cyst no apparent complications. RECOMMENDATIONS: Screening mammogram in one year.(Code:SM-B-01Y) Electronically Signed   By: Amie Portland M.D.   On: 05/02/2023 15:04    A/P:  Chavonna is a very pleasant 42 year old woman whose had progressive neck and radicular arm pain that has gotten worse over the last year. Despite appropriate conservative management her quality of life is continued to deteriorate. At this point I am given the progressive neuropathic pain and dysesthesias and the loss and quality of life despite conservative treatment I think it is reasonable to move forward with surgery.  We have gone over the surgical procedure in great detail which would be a two-level cervical disc replacement. We will utilize the [facility] simplify total disc arthroplasty. I have given her a pamphlet to review and we have discussed the risks and benefits of surgery. All of her questions were addressed. Risks and benefits of surgery were discussed with the patient. These include: Infection, bleeding, death, stroke, paralysis, ongoing or worse pain, need for additional surgery, nonunion, leak of spinal  fluid, adjacent segment degeneration requiring additional fusion surgery. Pseudoarthrosis (nonunion)requiring supplemental posterior fixation. Throat pain, swallowing difficulties, hoarseness or change in voice. Heterotopic ossification, inability to place the disc due to technical issues requiring bailout to a fusion procedure.  The patient was scheduled to have surgery at the outpatient surgical center. However her cardiologist noted that she is at intermediate risk and had recommended postoperative cardiac monitoring due to her history of SVT. As result of these issues I have elected to move for surgery from the surgical center to the hospital. I have explained the reason for the change to the patient and she is in agreement with the plan.

## 2023-05-25 NOTE — Brief Op Note (Signed)
05/25/2023  12:05 PM  PATIENT:  Vanessa Thomas  42 y.o. 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  PROCEDURE:  Procedure(s) with comments: Cervical anterior disc arthroplasty, 2 level (total disc replacement C5-7) (N/A) - 3.5 hours 3 c-bed  SURGEON:  Surgeons and Role:    Venita Lick, MD - Primary  PHYSICIAN ASSISTANT:   ASSISTANTS: Luther Bradley   ANESTHESIA:   general  EBL:  50 mL   BLOOD ADMINISTERED:none  DRAINS: none   LOCAL MEDICATIONS USED:  MARCAINE     SPECIMEN:  No Specimen  DISPOSITION OF SPECIMEN:  N/A  COUNTS:  YES  TOURNIQUET:  * No tourniquets in log *  DICTATION: .Dragon Dictation  PLAN OF CARE: Admit for overnight observation  PATIENT DISPOSITION:  PACU - hemodynamically stable.

## 2023-05-25 NOTE — Transfer of Care (Signed)
Immediate Anesthesia Transfer of Care Note  Patient: Vanessa Thomas  Procedure(s) Performed: Cervical anterior disc arthroplasty, 2 level (total disc replacement C5-7)  Patient Location: PACU  Anesthesia Type:General  Level of Consciousness: awake, alert , oriented, patient cooperative, and responds to stimulation  Airway & Oxygen Therapy: Patient Spontanous Breathing and Patient connected to nasal cannula oxygen  Post-op Assessment: Report given to RN, Post -op Vital signs reviewed and stable, and Patient moving all extremities X 4  Post vital signs: Reviewed and stable  Last Vitals:  Vitals Value Taken Time  BP 146/90 05/25/23 1208  Temp    Pulse 96 05/25/23 1211  Resp 13 05/25/23 1211  SpO2 97 % 05/25/23 1211  Vitals shown include unfiled device data.  Last Pain:  Vitals:   05/25/23 0605  TempSrc: Oral  PainSc: 4       Patients Stated Pain Goal: 1 (05/25/23 6213)  Complications: No notable events documented.

## 2023-05-25 NOTE — Anesthesia Procedure Notes (Signed)
Procedure Name: Intubation Date/Time: 05/25/2023 7:46 AM  Performed by: Shary Decamp, CRNAPre-anesthesia Checklist: Patient identified, Patient being monitored, Timeout performed, Emergency Drugs available and Suction available Patient Re-evaluated:Patient Re-evaluated prior to induction Oxygen Delivery Method: Circle System Utilized Preoxygenation: Pre-oxygenation with 100% oxygen Induction Type: IV induction Ventilation: Mask ventilation without difficulty Laryngoscope Size: Mac, 3 and Glidescope Grade View: Grade I Tube type: Oral Tube size: 7.0 mm Number of attempts: 1 Airway Equipment and Method: Video-laryngoscopy and Rigid stylet Placement Confirmation: ETT inserted through vocal cords under direct vision, positive ETCO2 and breath sounds checked- equal and bilateral Secured at: 21 cm Tube secured with: Tape Dental Injury: Teeth and Oropharynx as per pre-operative assessment

## 2023-05-25 NOTE — Op Note (Signed)
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  4 mm medium at both levels  BRIEF HISTORY: Vanessa Thomas is a 42 y.o. female who has had significant neck and radicular arm pain.  Imaging studies demonstrated a right sided cervical disc herniation at C6-7 and a left-sided cervical disc herniation at C5-6.  As result of the progressive nature of her pain and loss of quality of life despite appropriate conservative management we elected to move forward with surgery.  Patient had a history of SVT so the decision was made to move forward with surgery at the main hospital so that postoperatively she can admitted for telemetry overnight.  All appropriate risks, benefits, alternatives were discussed and consent was obtained.  PROCEDURE DETAILS: Patient was brought into the operating room and was properly positioned on the operating room table.  After induction with general anesthesia the patient was endotracheally intubated.  A timeout was taken to confirm all important data: including patient, procedure, and the level. Teds, SCD's were applied.   Anterior cervical spine was prepped and draped in a standard fashion.  Using fluoroscopy I marked out my incision site with the centered over the C6 vertebral body.  I infiltrated the incision with quarter percent Marcaine with epinephrine in the midline left-sided incision.  Sharp dissection was carried out down to and through the platysma.  Identified the sternocleidomastoid and performed a standard Smith-Robinson approach through the  avascular plane.  The trachea and esophagus were swept to the right identified and released the omohyoid from its sling and then began bluntly dissecting through the remainder of the deep cervical and prevertebral fascia.  Hand-held retractor was placed and then I placed the needle into the C5-6 disc space.  An x-ray was taken which confirmed I was at the appropriate level.  I marked the disc space with the Bovie.  Using bipolar electrocautery I mobilized the longus coli muscles from the mid body of C5 to the mid body of C7.  This was done bilaterally.  I then placed the Caspar retractor underneath the longus coli muscle deflated the endotracheal cuff and expanded the retractor to expose the C6-7 disc space level.  The endotracheal cuff was then reinflated.  An annulotomy was performed with a 15 blade scalpel and then remove the bulk of the disc material with pituitary rongeurs and curettes.  The began working posteriorly.  Using AP fluoroscopy I placed the action pins directly in the midportion of the C6 and C7 vertebral bodies.  I then distracted the intervertebral space and maintained it with the distraction pins and set this allowed me to continue to remove the posterior aspect of the disc material.  I then used a fine nerve hook to dissect underneath posterior longitudinal ligament and then resect the PLL with my 1 mm Kerrison rongeur.  Once this was done using my nerve hook I swept in the right posterior lateral corner and removed fragments of disc material consistent with what was seen on the preoperative MRI.  With the posterior longitudinal ligament resected and the disc fragments were removed I could now  easily pass my nerve hook under the uncovertebral joints bilaterally and along the undersurface of the vertebral bodies.  Once the discectomy.  Satisfactory I move forward with implantation.  Using the trial devices I elected to use the 4 mm medium implant.  This provided the best overall fit in both the  AP and lateral planes.  The osteotome was then used to make the bone cuts and the wound was irrigated copiously normal saline.  I checked 1 final time to ensure no fragments of bone or disc material came lodged posteriorly.  Once this was checked I then inserted the permanent implant.  The disc arthroplasty came to rest in the proper position.  I confirmed satisfactory position in both planes.  The implant was slightly countersunk and was within the dome of the inferior endplate of C6.  The bleeding bone edges were then sealed with bone wax and I remove the distraction pin from C7.  That bleeding area was also sealed with bone wax.  At this point I repositioned my retractors to expose the C5-6 level.  I again placed my distraction pin into the body of C5 using AP fluoroscopy to confirm it was properly positioned in the middle of the vertebral body.  The annulotomy was performed and using the same technique I used at C6-7 I removed all of the disc material.  I again used my 1 mm Kerrison rongeur and fine nerve hook to continue to dissect posteriorly until I encountered the posterior longitudinal ligament.  I then gently dissected through the posterior longitudinal ligament with the fine nerve hook until I could create a plane and then I resected the PLL with the 1 mm Kerrison rongeur and the posterior lateral corner 2 large fragments of disc material removed consistent with what was seen on the preoperative MRI.  I could now freely pass my nerve hook underneath the posterior aspect of the vertebral body and uncovertebral joints bilaterally.  I confirmed this with fluoroscopy I also confirmed that parallel endplate distraction using live fluoroscopy.  Once the discectomy proved satisfactory I then moved forward with the implant.  I trialed and elected to use the 4 mm medium implant.  Using the same technique I placed my osteotome to create the fin cut and then irrigated and placed the device.  The device was  properly situated in both the AP and lateral planes.  All distraction pins were removed and any bleeding surface from the bone was sealed with bone wax.  I then irrigated the wound copiously normal saline and confirmed that hemostasis.  I then returned the trach and esophagus to midline after removing the cast bar retractors.  Final x-rays were taken which demonstrated satisfactory positioning of the implants in both the AP and lateral planes.  The platysma was then reapproximated with interrupted 2-0 Vicryl sutures and the skin with 3-0 Monocryl.  Steri-Strips and dry dressing were applied and the patient was ultimately extubated and transferred the PACU without incident.  The end of the case all needle sponge counts were correct. Venita Lick, MD 05/25/2023 11:54 AM

## 2023-05-25 NOTE — Discharge Instructions (Signed)

## 2023-05-26 DIAGNOSIS — M50122 Cervical disc disorder at C5-C6 level with radiculopathy: Secondary | ICD-10-CM | POA: Diagnosis not present

## 2023-05-26 DIAGNOSIS — M50123 Cervical disc disorder at C6-C7 level with radiculopathy: Secondary | ICD-10-CM | POA: Diagnosis not present

## 2023-05-26 MED FILL — Thrombin For Soln Kit 20000 Unit: CUTANEOUS | Qty: 1 | Status: AC

## 2023-05-26 NOTE — Discharge Summary (Signed)
Patient ID: Vanessa Thomas MRN: [id number] DOB/AGE: 41-18-1982 41 y.o.  Admit date: 05/25/2023 Discharge date: 05/26/2023  Admission Diagnoses:  Principal Problem:   Cervical myelopathy [facility])   Discharge Diagnoses:  Principal Problem:   Cervical myelopathy (HCC)  status post Procedure(s): Cervical anterior disc arthroplasty, 2 level (total disc replacement C5-7)  Past Medical History:  Diagnosis Date   Aortic root dilatation (HCC)    Cervical radiculopathy    DORV, VSD (double-outlet right ventricle, ventricular septal defect)    Status post surgical correction   Generalized anxiety disorder    History of colonic polyps    History of nephrolithiasis    Inappropriate sinus tachycardia (HCC)    Status post sinus node modification    Kidney stones    Paroxysmal supraventricular tachycardia (HCC)    No dual AV pathway physiology by EP study    PONV (postoperative nausea and vomiting)    Raynauds disease     Surgeries: Procedure(s): Cervical anterior disc arthroplasty, 2 level (total disc replacement C5-7) on 05/25/2023   Consultants:   Discharged Condition: Improved  Hospital Course: Vanessa Thomas is an 42 y.o. female who was admitted 05/25/2023 for operative treatment of Cervical myelopathy (HCC). Patient failed conservative treatments (please see the history and physical for the specifics) and had severe unremitting pain that affects sleep, daily activities and work/hobbies. After pre-op clearance, the patient was taken to the operating room on 05/25/2023 and underwent  Procedure(s): Cervical anterior disc arthroplasty, 2 level (total disc replacement C5-7).    Patient was given perioperative antibiotics:  Anti-infectives (From admission, onward)    Start     Dose/Rate Route Frequency Ordered Stop   05/25/23 1230  ceFAZolin (ANCEF) IVPB 1 g/50 mL premix        1 g 100 mL/hr over 30 Minutes Intravenous Every 8 hours 05/25/23 1215 05/25/23 2150   05/25/23 1217   ceFAZolin (ANCEF) 1-4 GM/50ML-% IVPB       Note to Pharmacy: [facility] : cabinet override      05/25/23 1217 05/25/23 1506   05/25/23 1212  ceFAZolin (ANCEF) 2-4 GM/100ML-% IVPB  Status:  Discontinued       Note to Pharmacy: [facility] : cabinet override      05/25/23 1212 05/25/23 1218   05/25/23 0549  ceFAZolin (ANCEF) IVPB 2g/100 mL premix        2 g 200 mL/hr over 30 Minutes Intravenous 30 min pre-op 05/25/23 0549 05/25/23 0825        Patient was given sequential compression devices and early ambulation to prevent DVT.   Patient benefited maximally from hospital stay and there were no complications. At the time of discharge, the patient was urinating/moving their bowels without difficulty, tolerating a regular diet, pain is controlled with oral pain medications and they have been cleared by PT/OT.   Recent vital signs: Patient Vitals for the past 24 hrs:  BP Temp Temp src Pulse Resp SpO2  05/26/23 0248 (!) 146/90 97.6 F (36.4 C) Oral 78 14 98 %  05/25/23 2307 (!) 146/93 98.2 F (36.8 C) Oral 67 14 96 %  05/25/23 1900 (!) 152/91 98.9 F (37.2 C) Oral 69 12 96 %  05/25/23 1800 (!) 147/89 -- -- 86 -- 96 %  05/25/23 1600 (!) 149/99 -- -- 70 -- 95 %  05/25/23 1537 (!) 157/94 -- -- 75 -- --  05/25/23 1445 (!) 152/92 -- -- 82 10 96 %  05/25/23 1430 (!) 162/101 -- --  95 17 95 %  05/25/23 1415 (!) 145/90 -- -- 79 14 96 %  05/25/23 1400 (!) 143/95 -- -- 87 (!) 9 95 %  05/25/23 1345 (!) 145/103 -- -- 66 12 95 %  05/25/23 1330 138/89 -- -- 83 13 95 %  05/25/23 1315 (!) 140/88 -- -- 85 13 94 %  05/25/23 1300 (!) 140/93 -- -- 87 14 94 %  05/25/23 1245 (!) 145/85 -- -- 92 11 96 %  05/25/23 1230 (!) 143/91 -- -- 92 13 96 %  05/25/23 1215 135/89 -- -- 90 14 97 %  05/25/23 1208 (!) 146/90 98.1 F (36.7 C) -- (!) 112 12 98 %     Recent laboratory studies: No results for input(s): "WBC", "HGB", "HCT", "PLT", "NA", "K", "CL", "CO2", "BUN", "CREATININE", "GLUCOSE", "INR",  "CALCIUM" in the last 72 hours.  Invalid input(s): "PT", "2"   Discharge Medications:   Allergies as of 05/26/2023       Reactions   Meperidine Palpitations   Alters mental status, rapid heart rate         Medication List     STOP taking these medications    fluconazole 150 MG tablet Commonly known as: DIFLUCAN   ibuprofen 200 MG tablet Commonly known as: ADVIL       TAKE these medications    atorvastatin 20 MG tablet Commonly known as: LIPITOR Take 1 tablet (20 mg total) by mouth daily.   methocarbamol 500 MG tablet Commonly known as: ROBAXIN Take 1 tablet (500 mg total) by mouth every 8 (eight) hours as needed for up to 5 days for muscle spasms.   metoprolol succinate 100 MG 24 hr tablet Commonly known as: TOPROL-XL Take 100 mg by mouth at bedtime. Take with or immediately following a meal.   ondansetron 4 MG tablet Commonly known as: Zofran Take 1 tablet (4 mg total) by mouth every 8 (eight) hours as needed for nausea or vomiting.   oxyCODONE-acetaminophen 10-325 MG tablet Commonly known as: Percocet Take 1 tablet by mouth every 6 (six) hours as needed for up to 5 days for pain. What changed:  when to take this reasons to take this   polyethylene glycol 17 g packet Commonly known as: MIRALAX / GLYCOLAX Take 17 g by mouth daily as needed for moderate constipation.        Diagnostic Studies: DG Cervical Spine 2 or 3 views  Result Date: 05/25/2023 CLINICAL DATA:  Elective surgery.  C5-6 and C6-7 disc replacements. EXAM: CERVICAL SPINE - 4 VIEW COMPARISON:  MRI of the cervical spine 03/19/2021 FLUOROSCOPY: Exposure Index (as provided by the fluoroscopic device): 14.59 mGy Kerma FINDINGS: Intraoperative images demonstrate disc replacements at C5-6 and C6-7. Alignment is anatomic. IMPRESSION: Disc replacements at C5-6 and C6-7. No radiographic evidence for complication. Electronically Signed   By: Marin Roberts M.D.   On: 05/25/2023 16:23   DG  C-Arm 1-60 Min-No Report  Result Date: 05/25/2023 Fluoroscopy was utilized by the requesting physician.  No radiographic interpretation.   DG C-Arm 1-60 Min-No Report  Result Date: 05/25/2023 Fluoroscopy was utilized by the requesting physician.  No radiographic interpretation.   DG C-Arm 1-60 Min-No Report  Result Date: 05/25/2023 Fluoroscopy was utilized by the requesting physician.  No radiographic interpretation.   DG C-Arm 1-60 Min-No Report  Result Date: 05/25/2023 Fluoroscopy was utilized by the requesting physician.  No radiographic interpretation.   MM 3D DIAGNOSTIC MAMMOGRAM UNILATERAL LEFT BREAST  Addendum Date: 05/02/2023   ADDENDUM  REPORT: 05/02/2023 15:06 ADDENDUM: Patient underwent aspiration of the presumed left breast cyst, with the cyst completely collapsing confirming a benign etiology. Revised Impression: Small benign left breast cyst. Revised recommendation: Annual screening mammography Revised BI-RADS category: 2: Benign. Electronically Signed   By: Amie Portland M.D.   On: 05/02/2023 15:06   Result Date: 05/02/2023 CLINICAL DATA:  Screening recall for a left breast asymmetry. EXAM: DIGITAL DIAGNOSTIC UNILATERAL LEFT MAMMOGRAM WITH TOMOSYNTHESIS AND CAD; ULTRASOUND LEFT BREAST LIMITED TECHNIQUE: Left digital diagnostic mammography and breast tomosynthesis was performed. The images were evaluated with computer-aided detection. ; Targeted ultrasound examination of the left breast was performed. COMPARISON:  Previous exam(s). ACR Breast Density Category b: There are scattered areas of fibroglandular density. FINDINGS: Spot compression tomosynthesis images of the superior anterior left breast demonstrates a focal asymmetry versus subcentimeter obscured mass. Ultrasound targeted to the left breast at 2 o'clock, 5 cm from the nipple demonstrates a near anechoic oval mass measuring 6 x 3 x 6 mm. Ultrasound of the left axilla demonstrates multiple normal-appearing lymph nodes.  IMPRESSION: The 6 mm mass in the left breast at 2 o'clock is very likely a benign cyst. RECOMMENDATION: Ultrasound-guided aspiration is recommended for the cyst in the left breast. The patient is soon going to be undergoing a major surgery and would like this to be proven to be benign prior to surgery. I have discussed the findings and recommendations with the patient. If applicable, a reminder letter will be sent to the patient regarding the next appointment. BI-RADS CATEGORY  4: Suspicious. Electronically Signed: By: Frederico Hamman M.D. On: 04/26/2023 12:24   [place] LIMITED ULTRASOUND INCLUDING AXILLA LEFT BREAST   Addendum Date: 05/02/2023   ADDENDUM REPORT: 05/02/2023 15:06 ADDENDUM: Patient underwent aspiration of the presumed left breast cyst, with the cyst completely collapsing confirming a benign etiology. Revised Impression: Small benign left breast cyst. Revised recommendation: Annual screening mammography Revised BI-RADS category: 2: Benign. Electronically Signed   By: Amie Portland M.D.   On: 05/02/2023 15:06   Result Date: 05/02/2023 CLINICAL DATA:  Screening recall for a left breast asymmetry. EXAM: DIGITAL DIAGNOSTIC UNILATERAL LEFT MAMMOGRAM WITH TOMOSYNTHESIS AND CAD; ULTRASOUND LEFT BREAST LIMITED TECHNIQUE: Left digital diagnostic mammography and breast tomosynthesis was performed. The images were evaluated with computer-aided detection. ; Targeted ultrasound examination of the left breast was performed. COMPARISON:  Previous exam(s). ACR Breast Density Category b: There are scattered areas of fibroglandular density. FINDINGS: Spot compression tomosynthesis images of the superior anterior left breast demonstrates a focal asymmetry versus subcentimeter obscured mass. Ultrasound targeted to the left breast at 2 o'clock, 5 cm from the nipple demonstrates a near anechoic oval mass measuring 6 x 3 x 6 mm. Ultrasound of the left axilla demonstrates multiple normal-appearing lymph nodes.  IMPRESSION: The 6 mm mass in the left breast at 2 o'clock is very likely a benign cyst. RECOMMENDATION: Ultrasound-guided aspiration is recommended for the cyst in the left breast. The patient is soon going to be undergoing a major surgery and would like this to be proven to be benign prior to surgery. I have discussed the findings and recommendations with the patient. If applicable, a reminder letter will be sent to the patient regarding the next appointment. BI-RADS CATEGORY  4: Suspicious. Electronically Signed: By: Frederico Hamman M.D. On: 04/26/2023 12:24   US BREAST ASPIRATION LEFT  Result Date: 05/02/2023 CLINICAL DATA:  Patient presents for aspiration of a small left breast cyst. EXAM: ULTRASOUND GUIDED LEFT BREAST CYST ASPIRATION COMPARISON:  Previous exam(s). PROCEDURE: The patient and I discussed the procedure of ultrasound-guided aspiration including benefits and alternatives. We discussed the high likelihood of a successful procedure. We discussed the risks of the procedure including infection, bleeding, tissue injury, and inadequate sampling. Informed written consent was given. The usual time out protocol was performed immediately prior to the procedure. Using sterile technique, 1% lidocaine, under direct ultrasound visualization, needle aspiration of the 6 mm cyst at 2 o'clock, 5 cm the nipple was performed. The cyst collapsed with aspiration. IMPRESSION: Ultrasound-guided aspiration of a benign left breast cyst no apparent complications. RECOMMENDATIONS: Screening mammogram in one year.(Code:[id number]-B-01Y) Electronically Signed   By: Amie Portland M.D.   On: 05/02/2023 15:04    Discharge Instructions     Incentive spirometry RT   Complete by: As directed         Follow-up Information     Venita Lick, MD. Schedule an appointment as soon as possible for a visit in 2 week(s).   Specialty: Orthopedic Surgery Why: If symptoms worsen, For suture removal, For wound re-check Contact  information: [address] [place] [address] [phone]                 Discharge Plan:  discharge to home  Disposition: Vanessa Thomas underwent a successful 2 level cervical TDR. Per recommendations of her cardiologist she has been on telemetry monitoring s/p surgery.  She has remained stable without chest pain/pressure or abnormal EKG activity.  She has been ambulating and voiding spontaneously.  Patients pre-operative radicular arm pain has resolved and her only complaint is mild axillary neck pain.  Pain is controlled with oral medications.  Plan on d/c to home this AM.  Instructions and medications have been provided.  She will follow up with me in 2 weeks for wound check.  All of her questions and concerns were addressed.      Signed: Alvy Beal for Dr. Venita Lick [facility] [phone] 05/26/2023, 7:49 AM

## 2023-05-26 NOTE — Evaluation (Signed)
Physical Therapy Evaluation Patient Details Name: Vanessa Thomas MRN: [id number] DOB: 09/14/1980 Today's Date: 05/26/2023  History of Present Illness  42 yo female s/p cervical anterior disc arthoplasty C5-7 PMH aortic root dilatation, DORV, VSD, anxiety disorderm kidney stones, Raynauds disease,  Clinical Impression  Patient presents post cervical surgery with mobility limited by precautions though moving around unaided.  Educated on precautions and discussed post-surgical activity progression.  Able to negotiate steps appropriate for home entry.  Do not feel she needs initial PT follow up.  Will sign off as education completed and planned d/c this am.         If plan is discharge home, recommend the following: Assist for transportation;Assistance with cooking/housework   Can travel by private vehicle        Equipment Recommendations None recommended by PT  Recommendations for Other Services       Functional Status Assessment Patient has had a recent decline in their functional status and demonstrates the ability to make significant improvements in function in a reasonable and predictable amount of time.     Precautions / Restrictions Precautions Precautions: Cervical Precaution Booklet Issued: Yes (comment) Precaution Comments: handout provided Required Braces or Orthoses: Cervical Brace Cervical Brace: Soft collar;For comfort Restrictions Weight Bearing Restrictions: No      Mobility  Bed Mobility Overal bed mobility: Modified Independent             General bed mobility comments: coming up from elevated HOB, got an adjustable bed for this surgery, reviewed log roll technique with cervical handout    Transfers Overall transfer level: Modified independent Equipment used: None                    Ambulation/Gait Ambulation/Gait assistance: Independent Gait Distance (Feet): 120 Feet Assistive device: None Gait Pattern/deviations: Step-through pattern,  Decreased stride length       General Gait Details: slower pace, appropriate for situation  Stairs Stairs: Yes Stairs assistance: Supervision Stair Management: One rail Left, Alternating pattern, Step to pattern Number of Stairs: 4 General stair comments: self selecated sequence, step through to ascend, step to to descend  Wheelchair Mobility     Tilt Bed    Modified Rankin (Stroke Patients Only)       Balance Overall balance assessment: No apparent balance deficits (not formally assessed)                                           Pertinent Vitals/Pain Pain Assessment Pain Assessment: Faces Faces Pain Scale: Hurts a little bit Pain Location: incisional anterior neck Pain Descriptors / Indicators: Discomfort Pain Intervention(s): Monitored during session    Home Living Family/patient expects to be discharged to:: Private residence Living Arrangements: Spouse/significant other Available Help at Discharge: Family;Available PRN/intermittently Type of Home: House Home Access: Stairs to enter Entrance Stairs-Rails: Can reach both Entrance Stairs-Number of Steps: 4   Home Layout: One level Home Equipment: Shower seat - built in Additional Comments: dogs, works from home, 69 yo  , 8 mo grandchild    Prior Function Prior Level of Function : Independent/Modified Independent;Working/employed;Driving                     Extremity/Trunk Assessment   Upper Extremity Assessment Upper Extremity Assessment: Defer to OT evaluation    Lower Extremity Assessment Lower Extremity Assessment: Overall WFL for tasks  assessed    Cervical / Trunk Assessment Cervical / Trunk Assessment: Neck Surgery  Communication   Communication Communication: No apparent difficulties  Cognition Arousal: Alert Behavior During Therapy: WFL for tasks assessed/performed Overall Cognitive Status: Within Functional Limits for tasks assessed                                           General Comments General comments (skin integrity, edema, etc.): Discussed postural corrections and keeping items frequently used at easy to reach heights.  Also discussed cervical collar use and walking program and upper back strengthening when healed from surgery.    Exercises     Assessment/Plan    PT Assessment Patient does not need any further PT services  PT Problem List         PT Treatment Interventions      PT Goals (Current goals can be found in the Care Plan section)  Acute Rehab PT Goals PT Goal Formulation: All assessment and education complete, DC therapy    Frequency       Co-evaluation               AM-PAC PT "6 Clicks" Mobility  Outcome Measure Help needed turning from your back to your side while in a flat bed without using bedrails?: None Help needed moving from lying on your back to sitting on the side of a flat bed without using bedrails?: None Help needed moving to and from a bed to a chair (including a wheelchair)?: None Help needed standing up from a chair using your arms (e.g., wheelchair or bedside chair)?: None Help needed to walk in hospital room?: None Help needed climbing 3-5 steps with a railing? : None 6 Click Score: 24    End of Session Equipment Utilized During Treatment: Cervical collar Activity Tolerance: Patient tolerated treatment well Patient left: in bed;with call bell/phone within reach   PT Visit Diagnosis: Muscle weakness (generalized) (M62.81)    Time: 4098-1191 PT Time Calculation (min) (ACUTE ONLY): 12 min   Charges:   PT Evaluation $PT Eval Low Complexity: 1 Low   PT General Charges $$ ACUTE PT VISIT: 1 Visit         Sheran Lawless, PT Acute Rehabilitation Services Office:[phone] 05/26/2023   Elray Mcgregor 05/26/2023, 9:50 AM

## 2023-05-26 NOTE — Progress Notes (Signed)
Discharge instructions reviewed with pt. Copy of instructions and printed scripts given to pt to take to her pharmacy.   OT/PT are in the room now (0906) working with pt. Pt's husband is own his way to pick pt up.   Pt has belongings packed at bedside.   Wilver Tignor,RN SWOT

## 2023-05-26 NOTE — Evaluation (Signed)
Occupational Therapy Evaluation Patient Details Name: Vanessa Thomas MRN: [id number] DOB: [birth year] Today's Date: 05/26/2023   History of Present Illness 42 yo female s/p cervical anterior disc arthoplasty C5-7 PMH aortic root dilatation, DORV, VSD, anxiety disorderm kidney stones, Raynauds disease,   Clinical Impression   Patient evaluated by Occupational Therapy with no further acute OT needs identified. All education has been completed and the patient has no further questions. See below for any follow-up Occupational Therapy or equipment needs. OT to sign off. Thank you for referral.         If plan is discharge home, recommend the following: Assist for transportation    Functional Status Assessment  Patient has had a recent decline in their functional status and demonstrates the ability to make significant improvements in function in a reasonable and predictable amount of time.  Equipment Recommendations       Recommendations for Other Services       Precautions / Restrictions Precautions Precautions: Cervical Precaution Booklet Issued: Yes (comment) Precaution Comments: handout provided Required Braces or Orthoses: Cervical Brace Cervical Brace: Soft collar;For comfort Restrictions Weight Bearing Restrictions: No      Mobility Bed Mobility Overal bed mobility: Modified Independent                  Transfers Overall transfer level: Modified independent                        Balance                                           ADL either performed or assessed with clinical judgement   ADL Overall ADL's : Modified independent                                       General ADL Comments: educated on don doff brace, educated on washing the stockinette for brace. educated on wear schedule. completed sink level grooming with two cups Cervical precautions ( handout provided): Educated patient on don doff brace with  return demonstration, educated on oral care using cups, washing face with cloth, never to wash directly on incision site, avoid neck rotation flexion and extension, positioning with pillows in chair for bil UE, sleeping positioning, avoiding pushing / pulling with bil UE  Pt educated on need to notify doctor / RN of swallowing changes or choking..       Vision Baseline Vision/History: 0 No visual deficits       Perception         Praxis         Pertinent Vitals/Pain Pain Assessment Pain Assessment: No/denies pain     Extremity/Trunk Assessment Upper Extremity Assessment Upper Extremity Assessment: Overall WFL for tasks assessed (denies numbness tingling)   Lower Extremity Assessment Lower Extremity Assessment: Overall WFL for tasks assessed   Cervical / Trunk Assessment Cervical / Trunk Assessment: Neck Surgery   Communication Communication Communication: No apparent difficulties   Cognition Arousal: Alert Behavior During Therapy: WFL for tasks assessed/performed Overall Cognitive Status: Within Functional Limits for tasks assessed  General Comments  Discussed postural corrections and keeping items frequently used at easy to reach heights.  Also discussed cervical collar use and walking program and upper back strengthening when healed from surgery.    Exercises     Shoulder Instructions      Home Living Family/patient expects to be discharged to:: Private residence Living Arrangements: Spouse/significant other Available Help at Discharge: Family;Available PRN/intermittently Type of Home: House Home Access: Stairs to enter [employer] of Steps: 4 Entrance Stairs-Rails: Can reach both Home Layout: One level     Bathroom Shower/Tub: Producer, television/film/video: Standard     Home Equipment: Shower seat - built in   Additional Comments: dogs, works from home, 35 yo  , 8 mo grandchild       Prior Functioning/Environment Prior Level of Function : Independent/Modified Independent;Working/employed;Driving                        OT Problem List:        OT Treatment/Interventions:      OT Goals(Current goals can be found in the care plan section) Acute Rehab OT Goals Potential to Achieve Goals: Good  OT Frequency:      Co-evaluation              AM-PAC OT "6 Clicks" Daily Activity     Outcome Measure Help from another person eating meals?: None Help from another person taking care of personal grooming?: None Help from another person toileting, which includes using toliet, bedpan, or urinal?: None Help from another person bathing (including washing, rinsing, drying)?: None Help from another person to put on and taking off regular upper body clothing?: None Help from another person to put on and taking off regular lower body clothing?: None 6 Click Score: 24   End of Session Equipment Utilized During Treatment: Cervical collar Nurse Communication: Mobility status;Patient requests pain meds  Activity Tolerance: Patient tolerated treatment well Patient left: in bed;with call bell/phone within reach  OT Visit Diagnosis: Unsteadiness on feet (R26.81)                Time: 1308-6578 OT Time Calculation (min): 23 min Charges:  OT General Charges $OT Visit: 1 Visit OT Evaluation $OT Eval Moderate Complexity: 1 Mod   Brynn, OTR/L  Acute Rehabilitation Services Office: [phone] .   Mateo Flow 05/26/2023, 1:25 PM

## 2023-05-29 ENCOUNTER — Encounter: Payer: Self-pay | Admitting: Physician Assistant

## 2023-05-30 ENCOUNTER — Other Ambulatory Visit: Payer: Self-pay

## 2023-05-30 MED ORDER — FLUCONAZOLE 150 MG PO TABS: ORAL_TABLET | ORAL | 0 refills | Status: DC

## 2023-05-30 NOTE — Telephone Encounter (Signed)
Ok to send diflucan to pharmacy for patient

## 2023-07-04 DIAGNOSIS — Z4889 Encounter for other specified surgical aftercare: Secondary | ICD-10-CM | POA: Diagnosis not present

## 2023-07-26 ENCOUNTER — Encounter: Payer: Self-pay | Admitting: Physician Assistant

## 2023-07-26 ENCOUNTER — Telehealth: Payer: BC Managed Care – PPO | Admitting: Physician Assistant

## 2023-07-26 DIAGNOSIS — A084 Viral intestinal infection, unspecified: Secondary | ICD-10-CM

## 2023-07-26 MED ORDER — ONDANSETRON 4 MG PO TBDP: 4.0000 mg | ORAL_TABLET | Freq: Three times a day (TID) | ORAL | 0 refills | Status: DC | PRN

## 2023-07-26 NOTE — Progress Notes (Signed)

## 2023-07-26 NOTE — Telephone Encounter (Signed)
 FYI patient had e-visit and got Rx for Zofran , will keep PCP updated on how she is feeling

## 2023-08-09 DIAGNOSIS — M542 Cervicalgia: Secondary | ICD-10-CM

## 2023-08-09 HISTORY — DX: Cervicalgia: M54.2

## 2023-08-15 DIAGNOSIS — Z4889 Encounter for other specified surgical aftercare: Secondary | ICD-10-CM | POA: Diagnosis not present

## 2023-08-22 ENCOUNTER — Telehealth: Payer: BC Managed Care – PPO | Admitting: Physician Assistant

## 2023-08-22 DIAGNOSIS — R3989 Other symptoms and signs involving the genitourinary system: Secondary | ICD-10-CM | POA: Diagnosis not present

## 2023-08-22 MED ORDER — CEPHALEXIN 500 MG PO CAPS: 500.0000 mg | ORAL_CAPSULE | Freq: Two times a day (BID) | ORAL | 0 refills | Status: AC

## 2023-08-22 NOTE — Progress Notes (Signed)

## 2023-08-22 NOTE — Progress Notes (Signed)
I have spent 5 minutes in review of e-visit questionnaire, review and updating patient chart, medical decision making and response to patient.   Piedad Climes, PA-C

## 2023-09-07 ENCOUNTER — Encounter: Payer: Self-pay | Admitting: Physician Assistant

## 2023-09-07 ENCOUNTER — Ambulatory Visit: Payer: BC Managed Care – PPO | Admitting: Physician Assistant

## 2023-09-07 VITALS — BP 152/106 | HR 100 | Temp 97.9°F | Ht 72.0 in | Wt 207.8 lb

## 2023-09-07 DIAGNOSIS — N200 Calculus of kidney: Secondary | ICD-10-CM

## 2023-09-07 DIAGNOSIS — R300 Dysuria: Secondary | ICD-10-CM

## 2023-09-07 LAB — POC URINALSYSI DIPSTICK (AUTOMATED)
Bilirubin, UA: NEGATIVE
Blood, UA: POSITIVE
Glucose, UA: NEGATIVE
Ketones, UA: NEGATIVE
Leukocytes, UA: NEGATIVE
Nitrite, UA: NEGATIVE
Protein, UA: NEGATIVE
Spec Grav, UA: 1.01 (ref 1.010–1.025)
Urobilinogen, UA: 0.2 U/dL
pH, UA: 6 (ref 5.0–8.0)

## 2023-09-07 MED ORDER — TAMSULOSIN HCL 0.4 MG PO CAPS: 0.4000 mg | ORAL_CAPSULE | Freq: Every day | ORAL | 0 refills | Status: DC

## 2023-09-07 MED ORDER — KETOROLAC TROMETHAMINE 60 MG/2ML IM SOLN
60.0000 mg | Freq: Once | INTRAMUSCULAR | Status: AC
  Administered 2023-09-07: 60 mg via INTRAMUSCULAR

## 2023-09-07 MED ORDER — TRAMADOL HCL 50 MG PO TABS: 50.0000 mg | ORAL_TABLET | Freq: Three times a day (TID) | ORAL | 0 refills | Status: AC | PRN
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
## 2023-09-08 ENCOUNTER — Encounter: Payer: Self-pay | Admitting: Physician Assistant

## 2023-09-08 LAB — URINE CULTURE
MICRO NUMBER:: 16138061
SPECIMEN QUALITY:: ADEQUATE

## 2023-09-08 NOTE — Telephone Encounter (Signed)
 Please see patient update and advise if ED is recommended at this time

## 2023-09-11 ENCOUNTER — Encounter: Payer: Self-pay | Admitting: Physician Assistant

## 2023-09-29 ENCOUNTER — Other Ambulatory Visit: Payer: Self-pay | Admitting: Physician Assistant

## 2023-11-14 DIAGNOSIS — M542 Cervicalgia: Secondary | ICD-10-CM | POA: Insufficient documentation

## 2023-11-14 HISTORY — DX: Cervicalgia: M54.2

## 2023-12-07 ENCOUNTER — Telehealth: Admitting: Physician Assistant

## 2023-12-07 DIAGNOSIS — J019 Acute sinusitis, unspecified: Secondary | ICD-10-CM | POA: Diagnosis not present

## 2023-12-07 DIAGNOSIS — B9689 Other specified bacterial agents as the cause of diseases classified elsewhere: Secondary | ICD-10-CM

## 2023-12-07 MED ORDER — AMOXICILLIN-POT CLAVULANATE 875-125 MG PO TABS: 1.0000 | ORAL_TABLET | Freq: Two times a day (BID) | ORAL | 0 refills | Status: DC

## 2023-12-07 NOTE — Progress Notes (Signed)

## 2024-01-29 DIAGNOSIS — M51379 Other intervertebral disc degeneration, lumbosacral region without mention of lumbar back pain or lower extremity pain: Secondary | ICD-10-CM | POA: Insufficient documentation

## 2024-01-29 DIAGNOSIS — M5416 Radiculopathy, lumbar region: Secondary | ICD-10-CM

## 2024-01-29 DIAGNOSIS — M5459 Other low back pain: Secondary | ICD-10-CM | POA: Diagnosis not present

## 2024-01-29 DIAGNOSIS — M545 Low back pain, unspecified: Secondary | ICD-10-CM

## 2024-01-29 HISTORY — DX: Other intervertebral disc degeneration, lumbosacral region without mention of lumbar back pain or lower extremity pain: M51.379

## 2024-01-29 HISTORY — DX: Radiculopathy, lumbar region: M54.16

## 2024-01-29 HISTORY — DX: Low back pain, unspecified: M54.50

## 2024-02-16 DIAGNOSIS — M5416 Radiculopathy, lumbar region: Secondary | ICD-10-CM | POA: Diagnosis not present

## 2024-03-04 DIAGNOSIS — Z79899 Other long term (current) drug therapy: Secondary | ICD-10-CM | POA: Diagnosis not present

## 2024-03-04 DIAGNOSIS — M5416 Radiculopathy, lumbar region: Secondary | ICD-10-CM | POA: Diagnosis not present

## 2024-04-10 ENCOUNTER — Telehealth: Admitting: Family Medicine

## 2024-04-10 DIAGNOSIS — J019 Acute sinusitis, unspecified: Secondary | ICD-10-CM | POA: Diagnosis not present

## 2024-04-10 DIAGNOSIS — B9689 Other specified bacterial agents as the cause of diseases classified elsewhere: Secondary | ICD-10-CM

## 2024-04-10 MED ORDER — AZELASTINE HCL 0.1 % NA SOLN: 2.0000 | Freq: Two times a day (BID) | NASAL | 0 refills | Status: AC

## 2024-04-10 MED ORDER — AMOXICILLIN-POT CLAVULANATE 875-125 MG PO TABS: 1.0000 | ORAL_TABLET | Freq: Two times a day (BID) | ORAL | 0 refills | Status: DC

## 2024-04-10 NOTE — Progress Notes (Signed)
 E-Visit for Sinus Problems  We are sorry that you are not feeling well.  Here is how we plan to help!  Based on what you have shared with me it looks like you have sinusitis.  Sinusitis is inflammation and infection in the sinus cavities of the head.  Based on your presentation I believe you most likely have Acute Bacterial Sinusitis.  This is an infection caused by bacteria and is treated with antibiotics. I have prescribed Augmentin  875mg /125mg  one tablet twice daily with food, for 7 days. and I have also prescribed Azelastine Nasal Spray Use 1 spray in each nostril twice daily for 10-14 days you can use it with Flonase. You may use an oral decongestant such as Mucinex D or if you have glaucoma or high blood pressure use plain Mucinex. Saline nasal spray help and can safely be used as often as needed for congestion.  If you develop worsening sinus pain, fever or notice severe headache and vision changes, or if symptoms are not better after completion of antibiotic, please schedule an appointment with a health care provider.    Sinus infections are not as easily transmitted as other respiratory infection, however we still recommend that you avoid close contact with loved ones, especially the very young and elderly.  Remember to wash your hands thoroughly throughout the day as this is the number one way to prevent the spread of infection!  Home Care: Only take medications as instructed by your medical team. Complete the entire course of an antibiotic. Do not take these medications with alcohol. A steam or ultrasonic humidifier can help congestion.  You can place a towel over your head and breathe in the steam from hot water coming from a faucet. Avoid close contacts especially the very young and the elderly. Cover your mouth when you cough or sneeze. Always remember to wash your hands.  Get Help Right Away If: You develop worsening fever or sinus pain. You develop a severe head ache or visual  changes. Your symptoms persist after you have completed your treatment plan.  Make sure you Understand these instructions. Will watch your condition. Will get help right away if you are not doing well or get worse.  Your e-visit answers were reviewed by a board certified advanced clinical practitioner to complete your personal care plan.  Depending on the condition, your plan could have included both over the counter or prescription medications.  If there is a problem please reply  once you have received a response from your provider.  Your safety is important to us .  If you have drug allergies check your prescription carefully.    You can use MyChart to ask questions about today's visit, request a non-urgent call back, or ask for a work or school excuse for 24 hours related to this e-Visit. If it has been greater than 24 hours you will need to follow up with your provider, or enter a new e-Visit to address those concerns.  You will get an e-mail in the next two days asking about your experience.  I hope that your e-visit has been valuable and will speed your recovery. Thank you for using e-visits.  I have spent 5 minutes in review of e-visit questionnaire, review and updating patient chart, medical decision making and response to patient.   Chiquita CHRISTELLA Barefoot, NP

## 2024-04-12 ENCOUNTER — Other Ambulatory Visit: Payer: Self-pay

## 2024-04-12 DIAGNOSIS — R112 Nausea with vomiting, unspecified: Secondary | ICD-10-CM | POA: Insufficient documentation

## 2024-04-12 DIAGNOSIS — Z87442 Personal history of urinary calculi: Secondary | ICD-10-CM | POA: Insufficient documentation

## 2024-04-12 DIAGNOSIS — I4711 Inappropriate sinus tachycardia, so stated: Secondary | ICD-10-CM | POA: Insufficient documentation
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
## 2024-04-15 ENCOUNTER — Ambulatory Visit: Attending: Cardiology | Admitting: Cardiology

## 2024-04-15 DIAGNOSIS — I4510 Unspecified right bundle-branch block: Secondary | ICD-10-CM

## 2024-04-15 DIAGNOSIS — I471 Supraventricular tachycardia, unspecified: Secondary | ICD-10-CM

## 2024-04-15 DIAGNOSIS — I7789 Other specified disorders of arteries and arterioles: Secondary | ICD-10-CM

## 2024-04-15 DIAGNOSIS — I4711 Inappropriate sinus tachycardia, so stated: Secondary | ICD-10-CM

## 2024-04-15 DIAGNOSIS — Z8774 Personal history of (corrected) congenital malformations of heart and circulatory system: Secondary | ICD-10-CM

## 2024-05-06 DIAGNOSIS — Z5181 Encounter for therapeutic drug level monitoring: Secondary | ICD-10-CM | POA: Diagnosis not present

## 2024-05-06 DIAGNOSIS — M5416 Radiculopathy, lumbar region: Secondary | ICD-10-CM | POA: Diagnosis not present

## 2024-05-06 DIAGNOSIS — Z79899 Other long term (current) drug therapy: Secondary | ICD-10-CM | POA: Diagnosis not present
